# Patient Record
Sex: Male | Born: 1960
Health system: Southern US, Community
[De-identification: ages and names within clinical notes are randomized; demographics above are authoritative.]

## PROBLEM LIST (undated history)

## (undated) DIAGNOSIS — K219 Gastro-esophageal reflux disease without esophagitis: Secondary | ICD-10-CM

## (undated) HISTORY — PX: TONSILLECTOMY: SUR1361

## (undated) HISTORY — DX: Gastro-esophageal reflux disease without esophagitis: K21.9

---

## 2001-04-10 ENCOUNTER — Emergency Department (HOSPITAL_COMMUNITY): Admission: EM | Admit: 2001-04-10 | Discharge: 2001-04-10 | Payer: Self-pay | Admitting: Emergency Medicine

## 2001-04-10 ENCOUNTER — Encounter: Payer: Self-pay | Admitting: Emergency Medicine

## 2008-12-18 ENCOUNTER — Ambulatory Visit: Payer: Self-pay | Admitting: Diagnostic Radiology

## 2008-12-18 ENCOUNTER — Emergency Department (HOSPITAL_BASED_OUTPATIENT_CLINIC_OR_DEPARTMENT_OTHER): Admission: EM | Admit: 2008-12-18 | Discharge: 2008-12-18 | Payer: Self-pay | Admitting: Emergency Medicine

## 2009-07-19 ENCOUNTER — Emergency Department (HOSPITAL_BASED_OUTPATIENT_CLINIC_OR_DEPARTMENT_OTHER): Admission: EM | Admit: 2009-07-19 | Discharge: 2009-07-19 | Payer: Self-pay | Admitting: Emergency Medicine

## 2009-07-25 ENCOUNTER — Ambulatory Visit: Payer: Self-pay | Admitting: Diagnostic Radiology

## 2009-07-25 ENCOUNTER — Emergency Department (HOSPITAL_BASED_OUTPATIENT_CLINIC_OR_DEPARTMENT_OTHER): Admission: EM | Admit: 2009-07-25 | Discharge: 2009-07-26 | Payer: Self-pay | Admitting: Emergency Medicine

## 2010-02-27 ENCOUNTER — Emergency Department (HOSPITAL_BASED_OUTPATIENT_CLINIC_OR_DEPARTMENT_OTHER): Admission: EM | Admit: 2010-02-27 | Discharge: 2010-02-27 | Payer: Self-pay | Admitting: Emergency Medicine

## 2010-11-05 LAB — BASIC METABOLIC PANEL
BUN: 11 mg/dL (ref 6–23)
CO2: 28 mEq/L (ref 19–32)
Calcium: 9.6 mg/dL (ref 8.4–10.5)
Chloride: 105 mEq/L (ref 96–112)
Creatinine, Ser: 0.9 mg/dL (ref 0.4–1.5)
GFR calc Af Amer: >60 mL/min (ref >60)
GFR calc non Af Amer: >60 mL/min (ref >60)
Glucose, Bld: 103 mg/dL — ABNORMAL HIGH (ref 70–99)
Potassium: 4 mEq/L (ref 3.5–5.1)
Sodium: 142 mEq/L (ref 135–145)

## 2010-11-05 LAB — DIFFERENTIAL
Basophils Absolute: 0.1 10*3/uL (ref 0.0–0.1)
Basophils Relative: 1 % (ref 0–1)
Lymphocytes Relative: 23 % (ref 12–46)
Neutro Abs: 4.8 10*3/uL (ref 1.7–7.7)
Neutrophils Relative %: 64 % (ref 43–77)

## 2010-11-05 LAB — CBC
Hemoglobin: 15.3 g/dL (ref 13.0–17.0)
MCHC: 34.1 g/dL (ref 30.0–36.0)
Platelets: 219 10*3/uL (ref 150–400)
RDW: 12.2 % (ref 11.5–15.5)

## 2010-11-13 LAB — ETHANOL: Alcohol, Ethyl (B): 212 mg/dL — ABNORMAL HIGH (ref 0–10)

## 2015-08-20 ENCOUNTER — Emergency Department (HOSPITAL_BASED_OUTPATIENT_CLINIC_OR_DEPARTMENT_OTHER): Payer: Medicare Other

## 2015-08-20 ENCOUNTER — Encounter (HOSPITAL_BASED_OUTPATIENT_CLINIC_OR_DEPARTMENT_OTHER): Payer: Self-pay | Admitting: Emergency Medicine

## 2015-08-20 ENCOUNTER — Emergency Department (HOSPITAL_BASED_OUTPATIENT_CLINIC_OR_DEPARTMENT_OTHER)
Admission: EM | Admit: 2015-08-20 | Discharge: 2015-08-20 | Disposition: A | Discharge status: Home / Self Care | Payer: Medicare Other | Attending: Emergency Medicine | Admitting: Emergency Medicine

## 2015-08-20 DIAGNOSIS — Z79899 Other long term (current) drug therapy: Secondary | ICD-10-CM | POA: Insufficient documentation

## 2015-08-20 DIAGNOSIS — F17200 Nicotine dependence, unspecified, uncomplicated: Secondary | ICD-10-CM | POA: Diagnosis not present

## 2015-08-20 DIAGNOSIS — F17210 Nicotine dependence, cigarettes, uncomplicated: Secondary | ICD-10-CM | POA: Insufficient documentation

## 2015-08-20 DIAGNOSIS — R05 Cough: Secondary | ICD-10-CM | POA: Diagnosis present

## 2015-08-20 DIAGNOSIS — J40 Bronchitis, not specified as acute or chronic: Secondary | ICD-10-CM | POA: Diagnosis not present

## 2015-08-20 DIAGNOSIS — J209 Acute bronchitis, unspecified: Secondary | ICD-10-CM | POA: Insufficient documentation

## 2015-08-20 MED ORDER — ALBUTEROL SULFATE HFA 108 (90 BASE) MCG/ACT IN AERS
2.0000 | INHALATION_SPRAY | RESPIRATORY_TRACT | Status: DC | PRN
  Administered 2015-08-20: 2 via RESPIRATORY_TRACT
  Filled 2015-08-20: qty 6.7

## 2015-08-20 MED ORDER — IPRATROPIUM BROMIDE 0.02 % IN SOLN: 0.5000 mg | Freq: Once | RESPIRATORY_TRACT | Status: DC

## 2015-08-20 MED ORDER — AMOXICILLIN-POT CLAVULANATE 875-125 MG PO TABS: 1.0000 | ORAL_TABLET | Freq: Two times a day (BID) | ORAL | Status: DC

## 2015-08-20 MED ORDER — ALBUTEROL SULFATE (2.5 MG/3ML) 0.083% IN NEBU
2.5000 mg | INHALATION_SOLUTION | Freq: Once | RESPIRATORY_TRACT | Status: AC
  Administered 2015-08-20: 2.5 mg via RESPIRATORY_TRACT
  Filled 2015-08-20: qty 3

## 2015-08-20 MED ORDER — IPRATROPIUM-ALBUTEROL 0.5-2.5 (3) MG/3ML IN SOLN
3.0000 mL | Freq: Once | RESPIRATORY_TRACT | Status: AC
  Administered 2015-08-20: 3 mL via RESPIRATORY_TRACT
  Filled 2015-08-20: qty 3

## 2015-08-20 MED ORDER — PREDNISONE 20 MG PO TABS: 40.0000 mg | ORAL_TABLET | Freq: Every day | ORAL | Status: DC

## 2015-08-20 MED ORDER — ALBUTEROL SULFATE (2.5 MG/3ML) 0.083% IN NEBU: 5.0000 mg | INHALATION_SOLUTION | Freq: Once | RESPIRATORY_TRACT | Status: DC

## 2015-08-20 NOTE — ED Provider Notes (Signed)
CSN: [id number]     Arrival date & time 08/20/15  54 History   First MD Initiated Contact with Patient 08/20/15 1635     Chief Complaint  Patient presents with  . Cough     (Consider location/radiation/quality/duration/timing/severity/associated sxs/prior Treatment) HPI Comments: Patient presents with complaints of wheezing and cough productive of sputum over the past 4 weeks. Reports intermittent subjective fevers. He has been using an albuterol inhaler that he has at home with some relief but is nearly out. No URI symptoms. No lower extremity swelling or pain. No nausea, vomiting, or diarrhea. Patient has not been diagnosed with COPD but has an approximately 40-pack-year smoking history. No other treatments prior to arrival. No evidence to contacts.  Patient is a 55 y.o. male presenting with cough. The history is provided by the patient.  Cough Associated symptoms: fever (Subjective) and wheezing   Associated symptoms: no chest pain, no chills, no headaches, no myalgias, no rash, no rhinorrhea, no shortness of breath and no sore throat     History reviewed. No pertinent past medical history. History reviewed. No pertinent past surgical history. History reviewed. No pertinent family history. Social History  Substance Use Topics  . Smoking status: Current Every Day Smoker  . Smokeless tobacco: None  . Alcohol Use: Yes     Comment: occ    Review of Systems  Constitutional: Positive for fever (Subjective). Negative for chills.  HENT: Negative for rhinorrhea and sore throat.   Eyes: Negative for redness.  Respiratory: Positive for cough and wheezing. Negative for shortness of breath.   Cardiovascular: Negative for chest pain.  Gastrointestinal: Negative for nausea, vomiting, abdominal pain and diarrhea.  Genitourinary: Negative for dysuria.  Musculoskeletal: Negative for myalgias.  Skin: Negative for rash.  Neurological: Negative for headaches.      Allergies  Asa and  Codeine  Home Medications   Prior to Admission medications   Medication Sig Start Date End Date Taking? Authorizing Provider  omeprazole (PRILOSEC) 10 MG capsule Take 10 mg by mouth daily.   Yes Historical Provider, MD   BP 146/95 mmHg  Pulse 70  Temp(Src) 98 F (36.7 C) (Oral)  Resp 20  Ht 5\' 9"  (1.753 m)  Wt 102.059 kg  BMI 33.21 kg/m2  SpO2 100% Physical Exam  Constitutional: He appears well-developed and well-nourished.  HENT:  Head: Normocephalic and atraumatic.  Right Ear: Tympanic membrane, external ear and ear canal normal.  Left Ear: Tympanic membrane and ear canal normal.  Nose: Nose normal. No mucosal edema or rhinorrhea.  Mouth/Throat: Oropharynx is clear and moist. No oropharyngeal exudate, posterior oropharyngeal edema or posterior oropharyngeal erythema.  Eyes: Conjunctivae are normal. Right eye exhibits no discharge. Left eye exhibits no discharge.  Neck: Normal range of motion. Neck supple.  Cardiovascular: Normal rate, regular rhythm and normal heart sounds.   Pulmonary/Chest: Effort normal. No respiratory distress. He has wheezes (Mild scattered expiratory wheezing noted). He has no rales.  Abdominal: Soft. There is no tenderness.  Neurological: He is alert.  Skin: Skin is warm and dry.  Psychiatric: He has a normal mood and affect.  Nursing note and vitals reviewed.   ED Course  Procedures (including critical care time) Labs Review Labs Reviewed - No data to display  Imaging Review Dg Chest 2 View  08/20/2015  CLINICAL DATA:  Productive cough with brown sputum, LEFT lower chest pain from excessive coughing, congestion, body aches, lethargy and migraine for 3-4 weeks, history of bronchitis versus asthma, smoker EXAM: CHEST  2 VIEW COMPARISON:  None FINDINGS: Normal heart size, mediastinal contours, and pulmonary vascularity. Minimal central peribronchial thickening. No pulmonary infiltrate, pleural effusion or pneumothorax. Bones unremarkable. IMPRESSION:  Minimal bronchitic changes without infiltrate. Electronically Signed   By: Lavonia Dana M.D.   On: 08/20/2015 16:20   I have personally reviewed and evaluated these images and lab results as part of my medical decision-making.   EKG Interpretation None       Patient seen and examined. Medications ordered. Patient informed of x-ray results.  Vital signs reviewed and are as follows: BP 146/95 mmHg  Pulse 70  Temp(Src) 98 F (36.7 C) (Oral)  Resp 20  Ht 5\' 9"  (1.753 m)  Wt 102.059 kg  BMI 33.21 kg/m2  SpO2 100%  Patient feels somewhat better after breathing treatment. Will discharge to home with course of prednisone, Augmentin, and with albuterol inhaler.  Patient counseled on use of albuterol HFA. Instructed to use 1-2 puffs q 4 hours as needed for SOB.  Encouraged return to the emergency department with worsening shortness of breath, trouble breathing, fever, or other concerns. Patient verbalizes understanding and agrees with plan.   MDM   Final diagnoses:  Bronchitis   Smoker with persistent cough, increased production of mucus, and increased wheezing over the past 4 weeks. Symptoms improved with treatment in emergency department. Chest x-ray is negative for pneumonia. Feel this is likely bronchitis. Patient does not have diagnosed history of COPD. Will treat as mild COPD exacerbation, bronchitis with steroid and antibiotic. Patient otherwise appears well. Vital signs are normal. Feel that he can safely be discharged home with follow-up.  Carlisle Cater, PA-C 08/20/15 [place], MD 08/20/15 1949

## 2015-08-20 NOTE — Discharge Instructions (Signed)
Please read and follow all provided instructions.  Your diagnoses today include:  1. Bronchitis    Tests performed today include:  Chest x-ray - did not show pneumonia  Vital signs. See below for your results today.   Medications prescribed:   Albuterol inhaler - medication that opens up your airway  Use inhaler as follows: 1-2 puffs with spacer every 4 hours as needed for wheezing, cough, or shortness of breath.    Prednisone - steroid medicine   It is best to take this medication in the morning to prevent sleeping problems. If you are diabetic, monitor your blood sugar closely and stop taking Prednisone if blood sugar is over 300. Take with food to prevent stomach upset.    Augmentin - antibiotic  You have been prescribed an antibiotic medicine: take the entire course of medicine even if you are feeling better. Stopping early can cause the antibiotic not to work.  Take any prescribed medications only as directed.  Home care instructions:  Follow any educational materials contained in this packet.  Follow-up instructions: Please follow-up with your primary care provider in the next 7 days for further evaluation of your symptoms and management of your COPD.   Return instructions:   Please return to the Emergency Department if you experience worsening symptoms.  Please return with worsening wheezing, shortness of breath, or difficulty breathing.  Return with persistent fever above 101F.   Please return if you have any other emergent concerns.  Additional Information:  Your vital signs today were: BP 146/95 mmHg   Pulse 70   Temp(Src) 98 F (36.7 C) (Oral)   Resp 20   Ht 5\' 9"  (1.753 m)   Wt 102.059 kg   BMI 33.21 kg/m2   SpO2 100% If your blood pressure (BP) was elevated above 135/85 this visit, please have this repeated by your doctor within one month. --------------

## 2015-08-20 NOTE — ED Notes (Signed)
Patient states that he has had a cough and cold x 4 weeks. Reports that he is having chest tightness with cough.

## 2015-10-28 DIAGNOSIS — M25511 Pain in right shoulder: Secondary | ICD-10-CM | POA: Diagnosis not present

## 2015-12-15 DIAGNOSIS — A6920 Lyme disease, unspecified: Secondary | ICD-10-CM | POA: Diagnosis not present

## 2015-12-15 DIAGNOSIS — K219 Gastro-esophageal reflux disease without esophagitis: Secondary | ICD-10-CM | POA: Diagnosis not present

## 2015-12-15 DIAGNOSIS — M5412 Radiculopathy, cervical region: Secondary | ICD-10-CM | POA: Diagnosis not present

## 2015-12-15 DIAGNOSIS — R062 Wheezing: Secondary | ICD-10-CM | POA: Diagnosis not present

## 2016-02-10 ENCOUNTER — Emergency Department (HOSPITAL_BASED_OUTPATIENT_CLINIC_OR_DEPARTMENT_OTHER)
Admission: EM | Admit: 2016-02-10 | Discharge: 2016-02-10 | Disposition: A | Discharge status: Home / Self Care | Payer: Medicare Other | Attending: Emergency Medicine | Admitting: Emergency Medicine

## 2016-02-10 ENCOUNTER — Encounter (HOSPITAL_BASED_OUTPATIENT_CLINIC_OR_DEPARTMENT_OTHER): Payer: Self-pay | Admitting: Emergency Medicine

## 2016-02-10 ENCOUNTER — Emergency Department (HOSPITAL_BASED_OUTPATIENT_CLINIC_OR_DEPARTMENT_OTHER): Payer: Medicare Other

## 2016-02-10 DIAGNOSIS — Y939 Activity, unspecified: Secondary | ICD-10-CM | POA: Diagnosis not present

## 2016-02-10 DIAGNOSIS — F17200 Nicotine dependence, unspecified, uncomplicated: Secondary | ICD-10-CM | POA: Insufficient documentation

## 2016-02-10 DIAGNOSIS — Y999 Unspecified external cause status: Secondary | ICD-10-CM | POA: Insufficient documentation

## 2016-02-10 DIAGNOSIS — Y929 Unspecified place or not applicable: Secondary | ICD-10-CM | POA: Insufficient documentation

## 2016-02-10 DIAGNOSIS — M79642 Pain in left hand: Secondary | ICD-10-CM | POA: Diagnosis not present

## 2016-02-10 DIAGNOSIS — M795 Residual foreign body in soft tissue: Secondary | ICD-10-CM | POA: Diagnosis not present

## 2016-02-10 DIAGNOSIS — W109XXA Fall (on) (from) unspecified stairs and steps, initial encounter: Secondary | ICD-10-CM | POA: Diagnosis not present

## 2016-02-10 DIAGNOSIS — W19XXXA Unspecified fall, initial encounter: Secondary | ICD-10-CM

## 2016-02-10 MED ORDER — TRAMADOL HCL 50 MG PO TABS
50.0000 mg | ORAL_TABLET | Freq: Once | ORAL | Status: AC
  Administered 2016-02-10: 50 mg via ORAL
  Filled 2016-02-10: qty 1

## 2016-02-10 MED ORDER — TRAMADOL HCL 50 MG PO TABS: 50.0000 mg | ORAL_TABLET | Freq: Four times a day (QID) | ORAL | Status: DC | PRN

## 2016-02-10 NOTE — ED Notes (Addendum)
Pt in c/o L hand pain after falling on his steps, hand is swollen and bruised appearing. Ambulatory in NAD.

## 2016-02-10 NOTE — ED Notes (Signed)
Fell Thursday, braced fall with L hand, c/o hand pain, swelling noted, skin intact, no redness or bruising noted.

## 2016-02-10 NOTE — ED Notes (Addendum)
EDPA at [facility], pt seen by PA prior to RN assessment, see PA notes, pending orders, xray resulted, reviewed.

## 2016-02-10 NOTE — ED Provider Notes (Signed)
CSN: [id number]     Arrival date & time 02/10/16  2032 History  By signing my name below, I, Randa Evens, attest that this documentation has been prepared under the direction and in the presence of [employer], PA-C. Electronically Signed: Randa Evens, ED Scribe. 02/10/2016. 9:16 PM.    Chief Complaint  Patient presents with  . Hand Pain   The history is provided by the patient. No language interpreter was used.   HPI Comments: Jose Rush is a 55 y.o. male who presents to the Emergency Department complaining of fall onset 2 days prior. Pt states that he fell coming down his steps due to them being wet. Pt states that he fell onto his left hand. Pt is complaining of left hand pain. Pt states that he has some associated swelling, bruising and numbness in all of his fingers. Pt states that he has tried ice, ibuprofen and tylenol with no relief. Pt denies head injury or LOC. Pt denies neck pain or back pain. Pt states that he is left hand dominant.    History reviewed. No pertinent past medical history. History reviewed. No pertinent past surgical history. History reviewed. No pertinent family history. Social History  Substance Use Topics  . Smoking status: Current Every Day Smoker  . Smokeless tobacco: None  . Alcohol Use: Yes     Comment: occ    Review of Systems  Musculoskeletal: Positive for arthralgias.   A complete 10 system review of systems was obtained and all systems are negative except as noted in the HPI and PMH.     Allergies  Asa and Codeine  Home Medications   Prior to Admission medications   Medication Sig Start Date End Date Taking? Authorizing Provider  amoxicillin-clavulanate (AUGMENTIN) 875-125 MG tablet Take 1 tablet by mouth every 12 (twelve) hours. 08/20/15   Carlisle Cater, PA-C  omeprazole (PRILOSEC) 10 MG capsule Take 10 mg by mouth daily.    Historical Provider, MD  predniSONE (DELTASONE) 20 MG tablet Take 2 tablets (40 mg total) by mouth  daily. 08/20/15   Carlisle Cater, PA-C   BP 154/96 mmHg  Pulse 77  Temp(Src) 98.4 F (36.9 C) (Oral)  Resp 18  Ht 5\' 9"  (1.753 m)  Wt 200 lb (90.719 kg)  BMI 29.52 kg/m2  SpO2 99%   Physical Exam  Constitutional: He is oriented to person, place, and time. He appears well-developed and well-nourished. No distress.  HENT:  Head: Normocephalic and atraumatic.  Eyes: Conjunctivae and EOM are normal.  Neck: Normal range of motion. Neck supple.  Cardiovascular: Normal rate and regular rhythm.   Pulses:      Radial pulses are 2+ on the left side.  Pulmonary/Chest: Effort normal and breath sounds normal. No respiratory distress.  Musculoskeletal: Normal range of motion.  No tenderness to cervical, thoracic or lumbar spine. Good ROM of left wrist, left elbow, and left shoulder.  Pain over 4th and 5th metacarpal of the left hand.  Neurological: He is alert and oriented to person, place, and time. Gait normal.  Distal sensation of the left hand is intact  Skin: Skin is warm, dry and intact.  Psychiatric: He has a normal mood and affect. His behavior is normal.  Nursing note and vitals reviewed.   ED Course  Procedures (including critical care time) DIAGNOSTIC STUDIES: Oxygen Saturation is 99% on RA, normal by my interpretation.    COORDINATION OF CARE: 9:14 PM-Discussed treatment plan which includes hand x -ray with pt at  bedside and pt agreed to plan.     Labs Review Labs Reviewed - No data to display  Imaging Review Dg Hand Complete Left  02/10/2016  CLINICAL DATA:  Left hand pain after fall on steps. Swelling and bruising. EXAM: LEFT HAND - COMPLETE 3+ VIEW COMPARISON:  None. FINDINGS: Remote fifth metacarpal fracture with healed fracture deformity. No acute fracture or subluxation. The alignment and joint spaces are maintained. Mild soft tissue edema about the metacarpals. Linear 4 mm metallic foreign body in the soft tissues about the radial aspect of the second metacarpal head,  likely remote. IMPRESSION: 1. No acute fracture or subluxation. 2. Remote healed fifth metacarpal fracture. 3. Small metallic foreign body in the soft tissues adjacent to the second metacarpal head, likely remote. Electronically Signed   By: Jeb Levering M.D.   On: 02/10/2016 21:05   I have personally reviewed and evaluated these images and lab results as part of my medical decision-making.   EKG Interpretation None      MDM   Final diagnoses:  None  Patient presents today with left hand pain after falling down the steps two days ago.  No spinal tenderness on exam.  He denies any head injury or trauma.  He is ambulating without difficulty.  Full ROM of the left wrist, elbow, and shoulder.  Xray showing no acute fracture or subluxation.  Xray showing small metallic foreign body in the soft tissues, likely remote.  His skin is intact at this time.  No laceration or puncture wound associated with this fall.  Therefore, feel that this foreign body is not acute.  He is neurovascularly intact.  Stable for discharge.  Return precautions given.  I personally performed the services described in this documentation, which was scribed in my presence. The recorded information has been reviewed and is accurate.      Hyman Bible, PA-C 02/11/16 2354  Orlie Dakin, MD 02/12/16 Shelah Lewandowsky

## 2016-07-16 DIAGNOSIS — M2550 Pain in unspecified joint: Secondary | ICD-10-CM | POA: Diagnosis not present

## 2016-07-16 DIAGNOSIS — G44209 Tension-type headache, unspecified, not intractable: Secondary | ICD-10-CM | POA: Diagnosis not present

## 2016-08-13 ENCOUNTER — Encounter (HOSPITAL_BASED_OUTPATIENT_CLINIC_OR_DEPARTMENT_OTHER): Payer: Self-pay | Admitting: *Deleted

## 2016-08-13 ENCOUNTER — Emergency Department (HOSPITAL_BASED_OUTPATIENT_CLINIC_OR_DEPARTMENT_OTHER)
Admission: EM | Admit: 2016-08-13 | Discharge: 2016-08-13 | Disposition: A | Discharge status: Home / Self Care | Payer: Medicare Other | Attending: Emergency Medicine | Admitting: Emergency Medicine

## 2016-08-13 DIAGNOSIS — M542 Cervicalgia: Secondary | ICD-10-CM | POA: Insufficient documentation

## 2016-08-13 DIAGNOSIS — F17200 Nicotine dependence, unspecified, uncomplicated: Secondary | ICD-10-CM | POA: Diagnosis not present

## 2016-08-13 DIAGNOSIS — M791 Myalgia: Secondary | ICD-10-CM | POA: Diagnosis not present

## 2016-08-13 DIAGNOSIS — Z79899 Other long term (current) drug therapy: Secondary | ICD-10-CM | POA: Diagnosis not present

## 2016-08-13 DIAGNOSIS — F1290 Cannabis use, unspecified, uncomplicated: Secondary | ICD-10-CM | POA: Diagnosis not present

## 2016-08-13 MED ORDER — NAPROXEN 250 MG PO TABS: 250.0000 mg | ORAL_TABLET | Freq: Two times a day (BID) | ORAL | 0 refills | Status: DC

## 2016-08-13 MED ORDER — KETOROLAC TROMETHAMINE 60 MG/2ML IM SOLN
30.0000 mg | Freq: Once | INTRAMUSCULAR | Status: AC
  Administered 2016-08-13: 30 mg via INTRAMUSCULAR
  Filled 2016-08-13: qty 2

## 2016-08-13 MED ORDER — METHOCARBAMOL 500 MG PO TABS: 500.0000 mg | ORAL_TABLET | Freq: Two times a day (BID) | ORAL | 0 refills | Status: DC | PRN

## 2016-08-13 NOTE — ED Triage Notes (Signed)
Pt /o neck pain which radiates down left neck x 1 month

## 2016-08-13 NOTE — ED Provider Notes (Signed)
[facility] Provider Note   CSN: [id number] Arrival date & time: 08/13/16  2027   By signing my name below, I, Neta Mends, attest that this documentation has been prepared under the direction and in the presence of Waynetta Pean, PA-C. Electronically Signed: Neta Mends, ED Scribe. 08/13/2016. 10:03 PM.   History   Chief Complaint Chief Complaint  Patient presents with  . Neck Pain    The history is provided by the patient. No language interpreter was used.   HPI Comments:  Jose Rush is a 56 y.o. male who presents to the Emergency Department complaining of atraumatic constant left neck pain x 1 month. He Reports history of the same pain before and has seen his primary care provider previously about this 1 month ago. Pt states that the pain radiates to his left shoulder down his left arm. Pt states that the pain is exacerbated when moving. Pt complains of associated tingling in his left arm. He reports the pain is worse with movement and with touching the area. He has taken nothing for treatment of his symptoms today. Pt sees a PCP who has treated him with Tramadol which has provided no relief. No alleviating factors noted for current symptoms. Pt denies injury/trauma. Pt denies fever, neck stiffness, headache, trouble swallowing, visual changes, chest pain, weakness, cough, SOB.   History reviewed. No pertinent past medical history.  There are no active problems to display for this patient.   Past Surgical History:  Procedure Laterality Date  . TONSILLECTOMY         Home Medications    Prior to Admission medications   Medication Sig Start Date End Date Taking? Authorizing Provider  traMADol (ULTRAM) 50 MG tablet Take by mouth every 6 (six) hours as needed.   Yes Historical Provider, MD  methocarbamol (ROBAXIN) 500 MG tablet Take 1 tablet (500 mg total) by mouth 2 (two) times daily as needed for muscle spasms. 08/13/16   Waynetta Pean, PA-C    naproxen (NAPROSYN) 250 MG tablet Take 1 tablet (250 mg total) by mouth 2 (two) times daily with a meal. 08/13/16   Waynetta Pean, PA-C  omeprazole (PRILOSEC) 10 MG capsule Take 10 mg by mouth daily.    Historical Provider, MD  predniSONE (DELTASONE) 20 MG tablet Take 2 tablets (40 mg total) by mouth daily. 08/20/15   Carlisle Cater, PA-C  traMADol (ULTRAM) 50 MG tablet Take 1 tablet (50 mg total) by mouth every 6 (six) hours as needed. 02/10/16   Hyman Bible, PA-C    Family History History reviewed. No pertinent family history.  Social History Social History  Substance Use Topics  . Smoking status: Current Every Day Smoker    Packs/day: 1.00  . Smokeless tobacco: Not on file  . Alcohol use Yes     Comment: occ     Allergies   Asa [aspirin] and Codeine   Review of Systems Review of Systems  Constitutional: Negative for fever.  HENT: Negative for sore throat and trouble swallowing.   Eyes: Negative for photophobia.  Respiratory: Negative for cough and shortness of breath.   Cardiovascular: Negative for chest pain.  Musculoskeletal: Positive for myalgias and neck pain. Negative for back pain and neck stiffness.  Skin: Negative for rash.  Neurological: Negative for weakness and numbness.     Physical Exam Updated Vital Signs BP 140/99   Pulse 84   Temp 97.9 F (36.6 C)   Resp 18   Ht 5\' 9"  (  1.753 m)   Wt 90.7 kg   SpO2 100%   BMI 29.53 kg/m   Physical Exam  Constitutional: He appears well-developed and well-nourished. No distress.  Nontoxic appearing.  HENT:  Head: Normocephalic and atraumatic.  Eyes: Right eye exhibits no discharge. Left eye exhibits no discharge.  Neck: Normal range of motion. Neck supple. No JVD present. No tracheal deviation present.  Tenderness across his left trapezius musculature which reproduces his pain. No midline neck TTP. Good ROM.   Cardiovascular: Normal rate, regular rhythm and intact distal pulses.   Pulmonary/Chest: Effort  normal. No stridor. No respiratory distress.  Musculoskeletal: Normal range of motion. He exhibits tenderness. He exhibits no edema or deformity.  Tenderness over left trapezius. No midline neck or back TTP. No rashes. No overlying skin changes. Good grip strength bilaterally. Good strength in his bilateral upper extremities.  Lymphadenopathy:    He has no cervical adenopathy.  Neurological: He is alert. No sensory deficit. Coordination normal.  Sensation intact his bilateral upper extremities.  Skin: Skin is warm and dry. Capillary refill takes less than 2 seconds. No rash noted. He is not diaphoretic. No erythema. No pallor.  Psychiatric: He has a normal mood and affect. His behavior is normal.  Nursing note and vitals reviewed.    ED Treatments / Results  DIAGNOSTIC STUDIES:  Oxygen Saturation is 100% on RA, normal by my interpretation.    COORDINATION OF CARE:  10:03 PM Discussed treatment plan with pt at bedside and pt agreed to plan.   Labs (all labs ordered are listed, but only abnormal results are displayed) Labs Reviewed - No data to display  EKG  EKG Interpretation None       Radiology No results found.  Procedures Procedures (including critical care time)  Medications Ordered in ED Medications  ketorolac (TORADOL) injection 30 mg (30 mg Intramuscular Given 08/13/16 2212)     Initial Impression / Assessment and Plan / ED Course  I have reviewed the triage vital signs and the nursing notes.  Pertinent labs & imaging results that were available during my care of the patient were reviewed by me and considered in my medical decision making (see chart for details).  Clinical Course    This is a 56 y.o. male who presents to the Emergency Department complaining of atraumatic constant left neck pain x 1 month. He Reports history of the same pain before and has seen his primary care provider previously about this 1 month ago. Pt states that the pain radiates to his  left shoulder down his left arm. Pt states that the pain is exacerbated when moving. Pt complains of associated tingling in his left arm. He reports the pain is worse with movement and with touching the area. He has taken nothing for treatment of his symptoms today. Pt sees a PCP who has treated him with Tramadol which has provided no relief. He denies fevers, chest pain, SOB, weakness, or headache.  On exam he is afebrile and non-toxic appearing. His tenderness across his left trapezius musculature. He does report some tingling. He is neurovascularly intact. His pain is worse with palpation over his left trapezius and with movement. No meningeal signs. Suspect muscle pain but there could also be combined with some radicular pain. Will treat with naproxen and Robaxin have him follow-up closely with PCP. I discussed that he may need additional imaging by primary care for his radicular pain. I provided him with follow-up with sports medicine. I advised the patient  to follow-up with their primary care provider this week. I advised the patient to return to the emergency department with new or worsening symptoms or new concerns. The patient verbalized understanding and agreement with plan.     Final Clinical Impressions(s) / ED Diagnoses   Final diagnoses:  Neck pain on left side    New Prescriptions New Prescriptions   METHOCARBAMOL (ROBAXIN) 500 MG TABLET    Take 1 tablet (500 mg total) by mouth 2 (two) times daily as needed for muscle spasms.   NAPROXEN (NAPROSYN) 250 MG TABLET    Take 1 tablet (250 mg total) by mouth 2 (two) times daily with a meal.    I personally performed the services described in this documentation, which was scribed in my presence. The recorded information has been reviewed and is accurate.       Waynetta Pean, PA-C 08/13/16 [place], DO 08/13/16 2250

## 2016-08-13 NOTE — ED Notes (Signed)
States " My pinched nerve is really bothering me" Chronic pain , worse over last 2 months, denies recent injury

## 2016-08-19 ENCOUNTER — Encounter: Payer: Self-pay | Admitting: Family Medicine

## 2016-08-19 ENCOUNTER — Ambulatory Visit (INDEPENDENT_AMBULATORY_CARE_PROVIDER_SITE_OTHER): Payer: Medicare Other | Admitting: Family Medicine

## 2016-08-19 DIAGNOSIS — M5010 Cervical disc disorder with radiculopathy, unspecified cervical region: Secondary | ICD-10-CM | POA: Diagnosis not present

## 2016-08-19 MED ORDER — HYDROCODONE-ACETAMINOPHEN 7.5-325 MG PO TABS: 1.0000 | ORAL_TABLET | Freq: Four times a day (QID) | ORAL | 0 refills | Status: DC | PRN

## 2016-08-19 MED ORDER — PREDNISONE 10 MG PO TABS: ORAL_TABLET | ORAL | 0 refills | Status: DC

## 2016-08-19 NOTE — Patient Instructions (Signed)
You have cervical radiculopathy (a pinched nerve in the neck). Prednisone 6 day dose pack to relieve irritation/inflammation of the nerve. Ok to take flexeril or robaxin in addition to this for spasms Norco as needed for severe pain (no driving on this medicine). Consider cervical collar if severely painful. Simple range of motion exercises within limits of pain to prevent further stiffness. Consider physical therapy for stretching, exercises, traction, and modalities in the future. Heat 15 minutes at a time 3-4 times a day to help with spasms. Watch head position when on computers, texting, when sleeping in bed - should in line with back to prevent further nerve traction and irritation. Consider home traction unit if you get benefit with this in physical therapy. If not improving we will consider an MRI. Call me in a week to let me know how you're doing.

## 2016-08-19 NOTE — Progress Notes (Signed)
PCP: No primary care provider on file.  Subjective:   HPI: Patient is a 56 y.o. male here for neck pain.  Patient reports he's had prior issues with his neck. Current problem started 2 months ago. Pain in left side of neck radiating into his fingers with numbness and tingling all digits. Weakness of grip - difficulty holding things. Pain up to 10/10 level, sharp. Told in past he had a pinched nerve. No injury or trauma. Tried robaxin, naproxen from ED. Tried heat/ice. No bowel/bladder dysfunction.  No past medical history on file.  Current Outpatient Prescriptions on File Prior to Visit  Medication Sig Dispense Refill  . methocarbamol (ROBAXIN) 500 MG tablet Take 1 tablet (500 mg total) by mouth 2 (two) times daily as needed for muscle spasms. 20 tablet 0  . omeprazole (PRILOSEC) 10 MG capsule Take 10 mg by mouth daily.     No current facility-administered medications on file prior to visit.     Past Surgical History:  Procedure Laterality Date  . TONSILLECTOMY      Allergies  Allergen Reactions  . Asa [Aspirin] Nausea And Vomiting  . Codeine Nausea And Vomiting    Social History   Social History  . Marital status: Married    Spouse name: N/A  . Number of children: N/A  . Years of education: N/A   Occupational History  . Not on file.   Social History Main Topics  . Smoking status: Current Every Day Smoker    Packs/day: 1.00  . Smokeless tobacco: Never Used  . Alcohol use Yes     Comment: occ  . Drug use:     Types: Marijuana  . Sexual activity: Not on file   Other Topics Concern  . Not on file   Social History Narrative  . No narrative on file    No family history on file.  BP (!) 152/96   Pulse 79   Ht 5\' 9"  (1.753 m)   Wt 185 lb (83.9 kg)   BMI 27.32 kg/m   Review of Systems: See HPI above.     Objective:  Physical Exam:  Gen: NAD, comfortable in exam room  Neck: No gross deformity, swelling, bruising. Spasms left side. TTP left  cervical paraspinal region.  No midline/bony TTP. FROM neck - pain lateral rotations, extension. 4/5 strength with left elbow flexion and extension.  5/5 other muscle groups bilaterally. Sensation intact to light touch.   Trace left bicep MSR, 2+ right, bilateral triceps and brachioradialis tendons. Negative spurlings.   Assessment & Plan:  1. Cervical radiculopathy - with some weakness - ? Related to pain, decreased bicep reflex.  He will start with prednisone dose pack with norco as needed.  ROM exercises, heat for spasms.  Call us in a week.  Discussed ergonomic issues.  Start PT if improving, MRI if not improving.

## 2016-08-20 DIAGNOSIS — M5010 Cervical disc disorder with radiculopathy, unspecified cervical region: Secondary | ICD-10-CM | POA: Insufficient documentation

## 2016-08-20 NOTE — Assessment & Plan Note (Signed)
with some weakness - ? Related to pain, decreased bicep reflex.  He will start with prednisone dose pack with norco as needed.  ROM exercises, heat for spasms.  Call us in a week.  Discussed ergonomic issues.  Start PT if improving, MRI if not improving.

## 2016-09-11 ENCOUNTER — Emergency Department (HOSPITAL_BASED_OUTPATIENT_CLINIC_OR_DEPARTMENT_OTHER)
Admission: EM | Admit: 2016-09-11 | Discharge: 2016-09-11 | Disposition: A | Discharge status: Home / Self Care | Payer: Medicare Other | Attending: Emergency Medicine | Admitting: Emergency Medicine

## 2016-09-11 ENCOUNTER — Encounter (HOSPITAL_BASED_OUTPATIENT_CLINIC_OR_DEPARTMENT_OTHER): Payer: Self-pay

## 2016-09-11 DIAGNOSIS — M7592 Shoulder lesion, unspecified, left shoulder: Secondary | ICD-10-CM | POA: Insufficient documentation

## 2016-09-11 DIAGNOSIS — M779 Enthesopathy, unspecified: Secondary | ICD-10-CM | POA: Diagnosis not present

## 2016-09-11 DIAGNOSIS — F17200 Nicotine dependence, unspecified, uncomplicated: Secondary | ICD-10-CM | POA: Insufficient documentation

## 2016-09-11 DIAGNOSIS — M25512 Pain in left shoulder: Secondary | ICD-10-CM | POA: Diagnosis present

## 2016-09-11 MED ORDER — LIDOCAINE 5 % EX PTCH: 1.0000 | MEDICATED_PATCH | CUTANEOUS | 0 refills | Status: DC

## 2016-09-11 MED ORDER — NAPROXEN 375 MG PO TABS: 375.0000 mg | ORAL_TABLET | Freq: Two times a day (BID) | ORAL | 0 refills | Status: DC

## 2016-09-11 MED ORDER — KETOROLAC TROMETHAMINE 60 MG/2ML IM SOLN
60.0000 mg | Freq: Once | INTRAMUSCULAR | Status: AC
  Administered 2016-09-11: 60 mg via INTRAMUSCULAR
  Filled 2016-09-11: qty 2

## 2016-09-11 NOTE — ED Triage Notes (Signed)
Pt has a pinched nerve in left neck, c/o flare up of left arm pain since yesterday

## 2016-09-11 NOTE — ED Provider Notes (Signed)
[facility] Provider Note   CSN: [id number] Arrival date & time: 09/11/16  2118   By signing my name below, I, Charolotte Eke, attest that this documentation has been prepared under the direction and in the presence of Braylee Lal, MD. Electronically Signed: Charolotte Eke, Scribe. 09/11/16. 11:32 PM.   History   Chief Complaint Chief Complaint  Patient presents with  . Arm Pain    HPI Jose Rush is a 56 y.o. male with h/o pinched nerve who presents to the Emergency Department complaining of gradual onset, moderate-severe chronic left arm pain that worsened last night after a flair up of his pinched nerve. Pt has been in bed since the pain began to worsen. Pt refuses to move arm due to pain. Pt is waiting to hear back from the orthopedist.  The history is provided by the patient. No language interpreter was used.  Shoulder Pain   This is a chronic problem. The current episode started more than 1 week ago. The problem occurs constantly. The problem has not changed since onset.Pain location: left shoulder. The quality of the pain is described as aching. The pain is severe. Pertinent negatives include no numbness, no tingling and no itching. He has tried nothing for the symptoms. The treatment provided no relief. There has been no history of extremity trauma. Family history is significant for no rheumatoid arthritis.    History reviewed. No pertinent past medical history.  Patient Active Problem List   Diagnosis Date Noted  . Cervical disc disorder with radiculopathy of cervical region 08/20/2016    Past Surgical History:  Procedure Laterality Date  . TONSILLECTOMY         Home Medications    Prior to Admission medications   Medication Sig Start Date End Date Taking? Authorizing Provider  HYDROcodone-acetaminophen (NORCO) 7.5-325 MG tablet Take 1 tablet by mouth every 6 (six) hours as needed for moderate pain. 08/19/16   Dene Gentry, MD  methocarbamol  (ROBAXIN) 500 MG tablet Take 1 tablet (500 mg total) by mouth 2 (two) times daily as needed for muscle spasms. 08/13/16   Waynetta Pean, PA-C  omeprazole (PRILOSEC) 10 MG capsule Take 10 mg by mouth daily.    Historical Provider, MD  predniSONE (DELTASONE) 10 MG tablet 6 tabs po day 1, 5 tabs po day 2, 4 tabs po day 3, 3 tabs po day 4, 2 tabs po day 5, 1 tab po day 6 08/19/16   Dene Gentry, MD    Family History No family history on file.  Social History Social History  Substance Use Topics  . Smoking status: Current Every Day Smoker    Packs/day: 1.00  . Smokeless tobacco: Never Used  . Alcohol use Yes     Comment: occ     Allergies   Asa [aspirin] and Codeine   Review of Systems Review of Systems  Constitutional: Negative for fever.  HENT: Negative for congestion.   Respiratory: Negative for shortness of breath.   Cardiovascular: Negative for chest pain, palpitations and leg swelling.  Skin: Negative for itching and wound.  Neurological: Negative for tingling, weakness and numbness.  All other systems reviewed and are negative.    Physical Exam Updated Vital Signs BP 128/87 (BP Location: Right Arm)   Pulse 66   Temp 97.8 F (36.6 C) (Oral)   Resp 16   Ht 5\' 9"  (1.753 m)   Wt 200 lb (90.7 kg)   SpO2 98%   BMI 29.53 kg/m  Physical Exam  Constitutional: He is oriented to person, place, and time. He appears well-developed and well-nourished.  HENT:  Head: Normocephalic and atraumatic.  Nose: Nose normal.  Mouth/Throat: No oropharyngeal exudate.  Eyes: Conjunctivae and EOM are normal.  Neck: Normal range of motion and full passive range of motion without pain. Neck supple. No spinous process tenderness present. No neck rigidity. No erythema and normal range of motion present.  Cardiovascular: Normal rate, normal heart sounds and intact distal pulses.  Exam reveals no friction rub.   Pulmonary/Chest: Effort normal and breath sounds normal.  Abdominal: Soft.  Bowel sounds are normal. He exhibits no mass. There is no tenderness. There is no rebound and no guarding.  Musculoskeletal: Normal range of motion.       Left shoulder: He exhibits no bony tenderness, no swelling, no effusion, no crepitus, no deformity, no laceration, no pain, no spasm, normal pulse and normal strength.  No step off or crepitus cervical or thoracic spine. Intact distal pulses. Intact bicep and tricep distal pulse. Negative NEers test of the left shoulder  Neurological: He is alert and oriented to person, place, and time. He displays normal reflexes.  Skin: Skin is warm and dry. Capillary refill takes less than 2 seconds.  Psychiatric: He has a normal mood and affect.  Nursing note and vitals reviewed.    ED Treatments / Results   DIAGNOSTIC STUDIES: Oxygen Saturation is 100% on room air, normal by my interpretation.    COORDINATION OF CARE: 11:33 PM Discussed treatment plan with pt at bedside and pt agreed to plan.     Procedures Procedures (including critical care time)  Medications Ordered in ED  Medications  ketorolac (TORADOL) injection 60 mg (not administered)   Follow up with Shane Hudnall.  Take all medication as prescribed and continue to range shoulder.   All questions answered to patient's satisfaction. Based on history and exam patient has been appropriately medically screened and emergency conditions excluded. Patient is stable for discharge at this time. Strict return precautions given for any further episodes, persistent fever, weakness or any concerns.  I personally performed the services described in this documentation, which was scribed in my presence. The recorded information has been reviewed and is accurate.       Veatrice Kells, MD 09/11/16 [phone]

## 2016-09-18 ENCOUNTER — Ambulatory Visit: Payer: Medicare Other | Admitting: Family Medicine

## 2016-09-20 DIAGNOSIS — Z23 Encounter for immunization: Secondary | ICD-10-CM | POA: Diagnosis not present

## 2016-09-20 DIAGNOSIS — G44209 Tension-type headache, unspecified, not intractable: Secondary | ICD-10-CM | POA: Diagnosis not present

## 2016-09-20 DIAGNOSIS — Z131 Encounter for screening for diabetes mellitus: Secondary | ICD-10-CM | POA: Diagnosis not present

## 2016-09-20 DIAGNOSIS — Z136 Encounter for screening for cardiovascular disorders: Secondary | ICD-10-CM | POA: Diagnosis not present

## 2016-09-20 DIAGNOSIS — Z0000 Encounter for general adult medical examination without abnormal findings: Secondary | ICD-10-CM | POA: Diagnosis not present

## 2016-09-20 DIAGNOSIS — E785 Hyperlipidemia, unspecified: Secondary | ICD-10-CM | POA: Diagnosis not present

## 2016-09-20 DIAGNOSIS — Z1211 Encounter for screening for malignant neoplasm of colon: Secondary | ICD-10-CM | POA: Diagnosis not present

## 2016-09-20 DIAGNOSIS — K219 Gastro-esophageal reflux disease without esophagitis: Secondary | ICD-10-CM | POA: Diagnosis not present

## 2016-09-20 DIAGNOSIS — Z125 Encounter for screening for malignant neoplasm of prostate: Secondary | ICD-10-CM | POA: Diagnosis not present

## 2016-09-23 ENCOUNTER — Encounter: Payer: Self-pay | Admitting: Family Medicine

## 2016-09-23 ENCOUNTER — Ambulatory Visit (INDEPENDENT_AMBULATORY_CARE_PROVIDER_SITE_OTHER): Payer: Medicare Other | Admitting: Family Medicine

## 2016-09-23 VITALS — BP 150/65 | HR 80 | Ht 69.0 in | Wt 190.0 lb

## 2016-09-23 DIAGNOSIS — M5010 Cervical disc disorder with radiculopathy, unspecified cervical region: Secondary | ICD-10-CM

## 2016-09-23 MED ORDER — HYDROCODONE-ACETAMINOPHEN 7.5-325 MG PO TABS: 1.0000 | ORAL_TABLET | Freq: Four times a day (QID) | ORAL | 0 refills | Status: DC | PRN

## 2016-09-23 NOTE — Patient Instructions (Signed)
You have cervical radiculopathy (a pinched nerve in the neck). We will go ahead with MRI to further assess. Continue the naproxen from the ED. Norco as needed for severe pain (no driving on this medicine). Consider cervical collar if severely painful. Simple range of motion exercises within limits of pain to prevent further stiffness. Heat 15 minutes at a time 3-4 times a day to help with spasms. Watch head position when on computers, texting, when sleeping in bed - should in line with back to prevent further nerve traction and irritation. Consider home traction unit if you get benefit with this in physical therapy.

## 2016-09-25 NOTE — Progress Notes (Addendum)
PCP: No primary care provider on file.  Subjective:   HPI: Patient is a 56 y.o. male here for neck pain.  1/15: Patient reports he's had prior issues with his neck. Current problem started 2 months ago. Pain in left side of neck radiating into his fingers with numbness and tingling all digits. Weakness of grip - difficulty holding things. Pain up to 10/10 level, sharp. Told in past he had a pinched nerve. No injury or trauma. Tried robaxin, naproxen from ED. Tried heat/ice. No bowel/bladder dysfunction.  2/19: Patient reports he feels the same as last visit. Pain is severe, sharp, and constant at 8/10 level left side of neck. Pain and tingling constant from left side of neck down into all fingers of left hand. Given toradol shot in ED which helped temporarily. Taking naproxen with occasional tylenol. Did not notice benefit with prednisone. No skin changes. No new injuries.  No past medical history on file.  Current Outpatient Prescriptions on File Prior to Visit  Medication Sig Dispense Refill  . lidocaine (LIDODERM) 5 % Place 1 patch onto the skin daily. Remove & Discard patch within 12 hours or as directed by MD 14 patch 0  . methocarbamol (ROBAXIN) 500 MG tablet Take 1 tablet (500 mg total) by mouth 2 (two) times daily as needed for muscle spasms. 20 tablet 0  . naproxen (NAPROSYN) 375 MG tablet Take 1 tablet (375 mg total) by mouth 2 (two) times daily. 20 tablet 0  . omeprazole (PRILOSEC) 10 MG capsule Take 10 mg by mouth daily.    . predniSONE (DELTASONE) 10 MG tablet 6 tabs po day 1, 5 tabs po day 2, 4 tabs po day 3, 3 tabs po day 4, 2 tabs po day 5, 1 tab po day 6 21 tablet 0   No current facility-administered medications on file prior to visit.     Past Surgical History:  Procedure Laterality Date  . TONSILLECTOMY      Allergies  Allergen Reactions  . Asa [Aspirin] Nausea And Vomiting  . Codeine Nausea And Vomiting    Social History   Social History  .  Marital status: Married    Spouse name: N/A  . Number of children: N/A  . Years of education: N/A   Occupational History  . Not on file.   Social History Main Topics  . Smoking status: Current Every Day Smoker    Packs/day: 1.00  . Smokeless tobacco: Never Used  . Alcohol use Yes     Comment: occ  . Drug use: Yes    Types: Marijuana  . Sexual activity: Not on file   Other Topics Concern  . Not on file   Social History Narrative  . No narrative on file    No family history on file.  BP (!) 150/65   Pulse 80   Ht 5\' 9"  (1.753 m)   Wt 190 lb (86.2 kg)   BMI 28.06 kg/m   Review of Systems: See HPI above.     Objective:  Physical Exam:  Gen: NAD, comfortable in exam room  Neck: No gross deformity, swelling, bruising. Spasms left side. TTP left cervical paraspinal region.  No midline/bony TTP. FROM neck - pain all motions on left side. 4/5 strength with left elbow flexion and extension.  5/5 other muscle groups bilaterally. Sensation intact to light touch.   Trace left bicep MSR, 2+ right, bilateral triceps and brachioradialis tendons. Negative spurlings.   Assessment & Plan:  1. Cervical radiculopathy -  with some weakness, decreased bicep reflex.  Not improving with conservative measures.  Will go ahead with MRI at this point.  Naproxen with norco as needed.  Heat for spasms.  Will call with results and next steps (injections, neurosurgery referral).    Addendum:  MRI reviewed and discussed with patient.  He has moderate foraminal encroachment at C5-6 level - possible this is source of his pain but no obvious impingement.  Recommended trial of ESI but also going ahead with NCV/EMGs of left upper extremity - possible he has a brachial plexopathy or other cause for neuropathy lower than cervical spine level.

## 2016-09-25 NOTE — Assessment & Plan Note (Signed)
with some weakness, decreased bicep reflex.  Not improving with conservative measures.  Will go ahead with MRI at this point.  Naproxen with norco as needed.  Heat for spasms.  Will call with results and next steps (injections, neurosurgery referral).

## 2016-09-28 ENCOUNTER — Ambulatory Visit (HOSPITAL_BASED_OUTPATIENT_CLINIC_OR_DEPARTMENT_OTHER)
Admission: RE | Admit: 2016-09-28 | Discharge: 2016-09-28 | Disposition: A | Discharge status: Home / Self Care | Payer: Medicare Other | Source: Ambulatory Visit | Attending: Family Medicine | Admitting: Family Medicine

## 2016-09-28 DIAGNOSIS — M4602 Spinal enthesopathy, cervical region: Secondary | ICD-10-CM | POA: Diagnosis not present

## 2016-09-28 DIAGNOSIS — M50321 Other cervical disc degeneration at C4-C5 level: Secondary | ICD-10-CM | POA: Diagnosis not present

## 2016-09-28 DIAGNOSIS — M5010 Cervical disc disorder with radiculopathy, unspecified cervical region: Secondary | ICD-10-CM

## 2016-09-28 DIAGNOSIS — M4802 Spinal stenosis, cervical region: Secondary | ICD-10-CM | POA: Insufficient documentation

## 2016-10-02 ENCOUNTER — Other Ambulatory Visit: Payer: Self-pay | Admitting: Family Medicine

## 2016-10-02 DIAGNOSIS — M542 Cervicalgia: Secondary | ICD-10-CM

## 2016-10-02 NOTE — Addendum Note (Signed)
Addended by: Sherrie George F on: 10/02/2016 11:55 AM   Modules accepted: Orders

## 2016-10-17 ENCOUNTER — Ambulatory Visit
Admission: RE | Admit: 2016-10-17 | Discharge: 2016-10-17 | Disposition: A | Discharge status: Home / Self Care | Payer: Medicare Other | Source: Ambulatory Visit | Attending: Family Medicine | Admitting: Family Medicine

## 2016-10-17 DIAGNOSIS — M47812 Spondylosis without myelopathy or radiculopathy, cervical region: Secondary | ICD-10-CM | POA: Diagnosis not present

## 2016-10-17 DIAGNOSIS — M542 Cervicalgia: Secondary | ICD-10-CM

## 2016-10-17 MED ORDER — TRIAMCINOLONE ACETONIDE 40 MG/ML IJ SUSP (RADIOLOGY)
60.0000 mg | Freq: Once | INTRAMUSCULAR | Status: AC
  Administered 2016-10-17: 60 mg via EPIDURAL

## 2016-10-17 MED ORDER — IOPAMIDOL (ISOVUE-M 300) INJECTION 61%
1.0000 mL | Freq: Once | INTRAMUSCULAR | Status: AC | PRN
  Administered 2016-10-17: 1 mL via EPIDURAL

## 2016-10-17 NOTE — Discharge Instructions (Signed)

## 2016-11-06 ENCOUNTER — Encounter: Payer: Self-pay | Admitting: Neurology

## 2016-11-06 ENCOUNTER — Ambulatory Visit (INDEPENDENT_AMBULATORY_CARE_PROVIDER_SITE_OTHER): Payer: Self-pay | Admitting: Neurology

## 2016-11-06 ENCOUNTER — Ambulatory Visit (INDEPENDENT_AMBULATORY_CARE_PROVIDER_SITE_OTHER): Payer: Medicare Other | Admitting: Neurology

## 2016-11-06 DIAGNOSIS — M79602 Pain in left arm: Secondary | ICD-10-CM | POA: Diagnosis not present

## 2016-11-06 DIAGNOSIS — M5010 Cervical disc disorder with radiculopathy, unspecified cervical region: Secondary | ICD-10-CM

## 2016-11-06 NOTE — Progress Notes (Signed)
Please refer to EMG and nerve conduction study procedure note. 

## 2016-11-06 NOTE — Procedures (Signed)
     HISTORY:  Jose Rush is a 56 year old gentleman with a history of left shoulder and left arm discomfort and numbness that has been present for one year, gradually getting worse. The patient believes that there may be some slight weakness in the arm. He is being evaluated for a possible neuropathy or a cervical radiculopathy.  NERVE CONDUCTION STUDIES:  Nerve conduction studies were performed on the left upper extremity. The distal motor latencies and motor amplitudes for the median and ulnar nerves were within normal limits. The F wave latencies and nerve conduction velocities for these nerves were also normal. The sensory latencies for the median and ulnar nerves were normal.   EMG STUDIES:  EMG study was performed on the left upper extremity:  The first dorsal interosseous muscle reveals 2 to 4 K units with full recruitment. No fibrillations or positive waves were noted. The abductor pollicis brevis muscle reveals 2 to 4 K units with full recruitment. No fibrillations or positive waves were noted. The extensor indicis proprius muscle reveals 1 to 3 K units with full recruitment. No fibrillations or positive waves were noted. The pronator teres muscle reveals 2 to 3 K units with full recruitment. No fibrillations or positive waves were noted. The biceps muscle reveals 1 to 2 K units with full recruitment. No fibrillations or positive waves were noted. The triceps muscle reveals 2 to 4 K units with full recruitment. No fibrillations or positive waves were noted. The anterior deltoid muscle reveals 2 to 3 K units with full recruitment. No fibrillations or positive waves were noted. The cervical paraspinal muscles were tested at 2 levels. No abnormalities of insertional activity were seen at either level tested. There was good relaxation.   IMPRESSION:  Nerve conduction studies done on the left upper extremity were within normal limits. No evidence of a neuropathy is seen. The  patient refused comparison study of the right arm. EMG evaluation of the left upper extremity was unremarkable, without evidence of an overlying cervical radiculopathy.  Jill Alexanders MD 11/06/2016 1:24 PM  [facility] [address] [place], [place] [address]  Phone [phone] Fax [phone]

## 2016-11-19 DIAGNOSIS — R1013 Epigastric pain: Secondary | ICD-10-CM | POA: Diagnosis not present

## 2016-11-19 DIAGNOSIS — K439 Ventral hernia without obstruction or gangrene: Secondary | ICD-10-CM | POA: Diagnosis not present

## 2016-11-19 DIAGNOSIS — Z1211 Encounter for screening for malignant neoplasm of colon: Secondary | ICD-10-CM | POA: Diagnosis not present

## 2016-12-11 ENCOUNTER — Ambulatory Visit (INDEPENDENT_AMBULATORY_CARE_PROVIDER_SITE_OTHER): Payer: Medicare Other | Admitting: Podiatry

## 2016-12-11 ENCOUNTER — Encounter: Payer: Self-pay | Admitting: Podiatry

## 2016-12-11 VITALS — BP 160/98 | HR 79 | Ht 70.0 in | Wt 189.0 lb

## 2016-12-11 DIAGNOSIS — M79672 Pain in left foot: Secondary | ICD-10-CM | POA: Diagnosis not present

## 2016-12-11 DIAGNOSIS — M21969 Unspecified acquired deformity of unspecified lower leg: Secondary | ICD-10-CM | POA: Diagnosis not present

## 2016-12-11 DIAGNOSIS — L600 Ingrowing nail: Secondary | ICD-10-CM

## 2016-12-11 DIAGNOSIS — M79671 Pain in right foot: Secondary | ICD-10-CM | POA: Diagnosis not present
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
## 2016-12-11 NOTE — Patient Instructions (Signed)
Seen for abnormal painful nail 5th left. Noted of normal bone structure under the nail 5th digit. May benefit from total matrixectomy 5th left. Return as needed.

## 2016-12-24 ENCOUNTER — Other Ambulatory Visit: Payer: Self-pay | Admitting: Family Medicine

## 2016-12-24 DIAGNOSIS — K2270 Barrett's esophagus without dysplasia: Secondary | ICD-10-CM | POA: Diagnosis not present

## 2016-12-24 DIAGNOSIS — M5412 Radiculopathy, cervical region: Secondary | ICD-10-CM

## 2016-12-24 DIAGNOSIS — K2930 Chronic superficial gastritis without bleeding: Secondary | ICD-10-CM | POA: Diagnosis not present

## 2016-12-24 DIAGNOSIS — K228 Other specified diseases of esophagus: Secondary | ICD-10-CM | POA: Diagnosis not present

## 2016-12-24 DIAGNOSIS — R12 Heartburn: Secondary | ICD-10-CM | POA: Diagnosis not present

## 2016-12-24 DIAGNOSIS — Z1211 Encounter for screening for malignant neoplasm of colon: Secondary | ICD-10-CM | POA: Diagnosis not present

## 2016-12-24 DIAGNOSIS — K319 Disease of stomach and duodenum, unspecified: Secondary | ICD-10-CM | POA: Diagnosis not present

## 2016-12-24 DIAGNOSIS — D126 Benign neoplasm of colon, unspecified: Secondary | ICD-10-CM | POA: Diagnosis not present

## 2016-12-26 DIAGNOSIS — K319 Disease of stomach and duodenum, unspecified: Secondary | ICD-10-CM | POA: Diagnosis not present

## 2016-12-26 DIAGNOSIS — K2270 Barrett's esophagus without dysplasia: Secondary | ICD-10-CM | POA: Diagnosis not present

## 2017-01-01 ENCOUNTER — Ambulatory Visit: Payer: Medicare Other | Admitting: Podiatry

## 2017-01-02 ENCOUNTER — Ambulatory Visit
Admission: RE | Admit: 2017-01-02 | Discharge: 2017-01-02 | Disposition: A | Discharge status: Home / Self Care | Payer: Medicare Other | Source: Ambulatory Visit | Attending: Family Medicine | Admitting: Family Medicine

## 2017-01-02 DIAGNOSIS — M5412 Radiculopathy, cervical region: Secondary | ICD-10-CM

## 2017-01-02 DIAGNOSIS — M5410 Radiculopathy, site unspecified: Secondary | ICD-10-CM | POA: Diagnosis not present

## 2017-01-02 MED ORDER — IOPAMIDOL (ISOVUE-M 300) INJECTION 61%
1.0000 mL | Freq: Once | INTRAMUSCULAR | Status: AC | PRN
  Administered 2017-01-02: 1 mL via EPIDURAL

## 2017-01-02 MED ORDER — TRIAMCINOLONE ACETONIDE 40 MG/ML IJ SUSP (RADIOLOGY)
60.0000 mg | Freq: Once | INTRAMUSCULAR | Status: AC
  Administered 2017-01-02: 60 mg via EPIDURAL

## 2017-01-08 ENCOUNTER — Ambulatory Visit (INDEPENDENT_AMBULATORY_CARE_PROVIDER_SITE_OTHER): Payer: Medicare Other | Admitting: Podiatry

## 2017-01-08 ENCOUNTER — Encounter: Payer: Self-pay | Admitting: Podiatry

## 2017-01-08 DIAGNOSIS — M7989 Other specified soft tissue disorders: Secondary | ICD-10-CM

## 2017-01-08 DIAGNOSIS — M79675 Pain in left toe(s): Secondary | ICD-10-CM

## 2017-01-08 DIAGNOSIS — K2270 Barrett's esophagus without dysplasia: Secondary | ICD-10-CM | POA: Diagnosis not present

## 2017-01-08 DIAGNOSIS — L600 Ingrowing nail: Secondary | ICD-10-CM | POA: Diagnosis not present

## 2017-01-08 DIAGNOSIS — D122 Benign neoplasm of ascending colon: Secondary | ICD-10-CM | POA: Diagnosis not present

## 2017-01-08 NOTE — Patient Instructions (Signed)
Ingrown nail surgery was done on 5th toe left foot. Follow soaking instruction.  Some redness and drainage is expected. Call the office if the area gets feverish with increased redness and drainage. Return in one week.

## 2017-01-08 NOTE — Progress Notes (Signed)
56 year old male presents for scheduled nail surgery on 5th toe left that has been hurting him for many years while in closed in shoes and ambulation.  Consent form reviewed and signed.  Dx. Deformed nail with pain upon ambulation 5th bilateral. Procedure done: Total matrixectomy 5th toe left.  Affected 5th toe was anesthetized with total 21ml mixture of 50/50 0.5% Marcaine plain and 1% Xylocaine plain. Affected 5th toe nail was reflected with a nail elevator and removed from proximal skin folder. Proximal nail matrix tissue under skin folder was cauterized with Phenol soaked cotton applicator x 4 and neutralized with Alcohol soaked cotton applicator. The wound was dressed with Amerigel ointment dressing. Home care instructions and supply dispensed.  Return in 1 week for follow up.

## 2017-01-15 ENCOUNTER — Encounter: Payer: Self-pay | Admitting: Podiatry

## 2017-01-15 ENCOUNTER — Ambulatory Visit (INDEPENDENT_AMBULATORY_CARE_PROVIDER_SITE_OTHER): Payer: Medicare Other | Admitting: Podiatry

## 2017-01-15 DIAGNOSIS — Z9889 Other specified postprocedural states: Secondary | ICD-10-CM

## 2017-01-15 NOTE — Progress Notes (Signed)
1 week follow up on left 5th toe nail surgery. Wound healing is normal. Keep it covered during the day. Continue soaking till tenderness subside. Return as needed.

## 2017-01-15 NOTE — Patient Instructions (Signed)
1 week follow up on left 5th toe nail surgery. Wound healing is normal. Keep it covered during the day. Continue soaking till tenderness subside. Return as needed.

## 2018-08-10 IMAGING — XA DG INJECT/[PERSON_NAME] INC NEEDLE/CATH/PLC EPI/CERV/THOR W/IMG
2 series · 2 of 2 positions shown · non-contrast
Comparison: none

CLINICAL DATA: Spondylosis without myelopathy. Left arm pain
radiating all the way to the fingers.

[Series 1: ortho standard · 1 of 1 slices shown (1 of 2)]
[im 1/1]
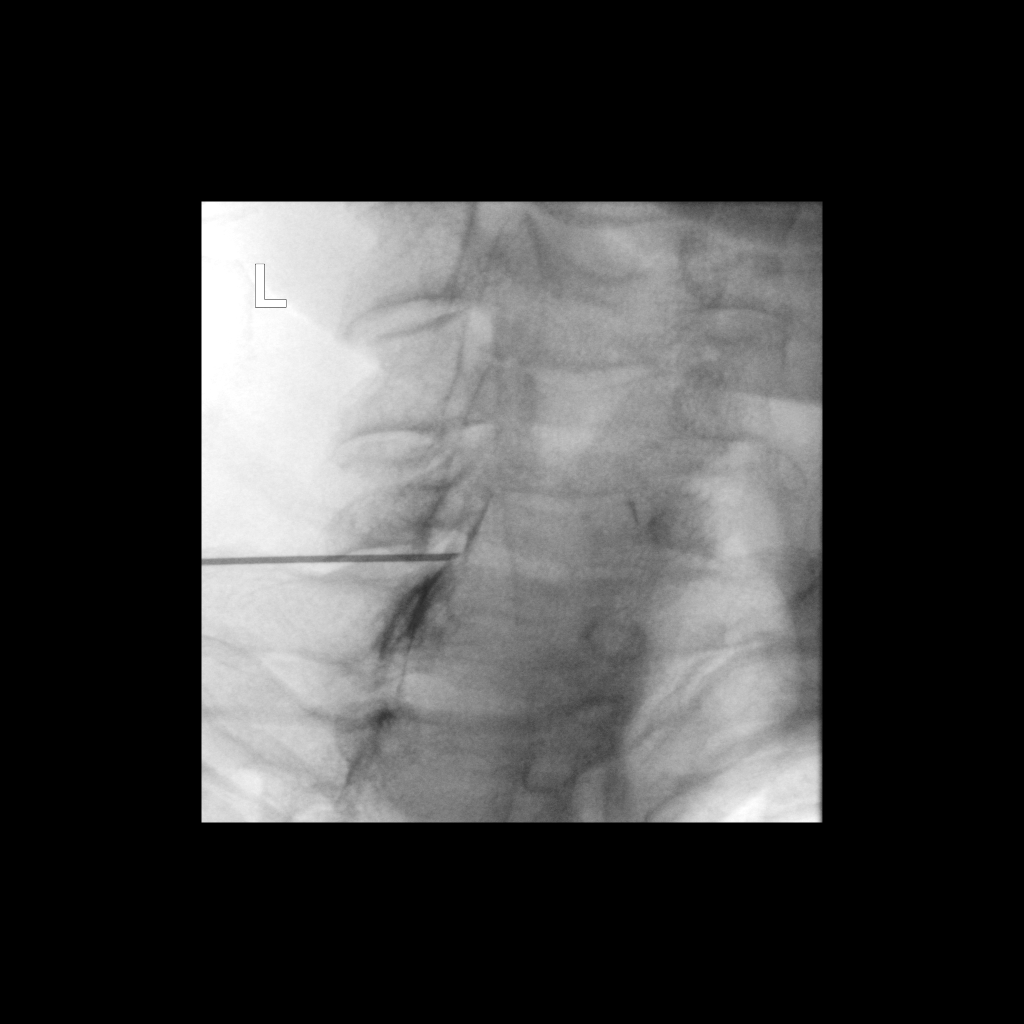

[Series 2: ortho standard · 1 of 1 slices shown (2 of 2)]
[im 1/1]
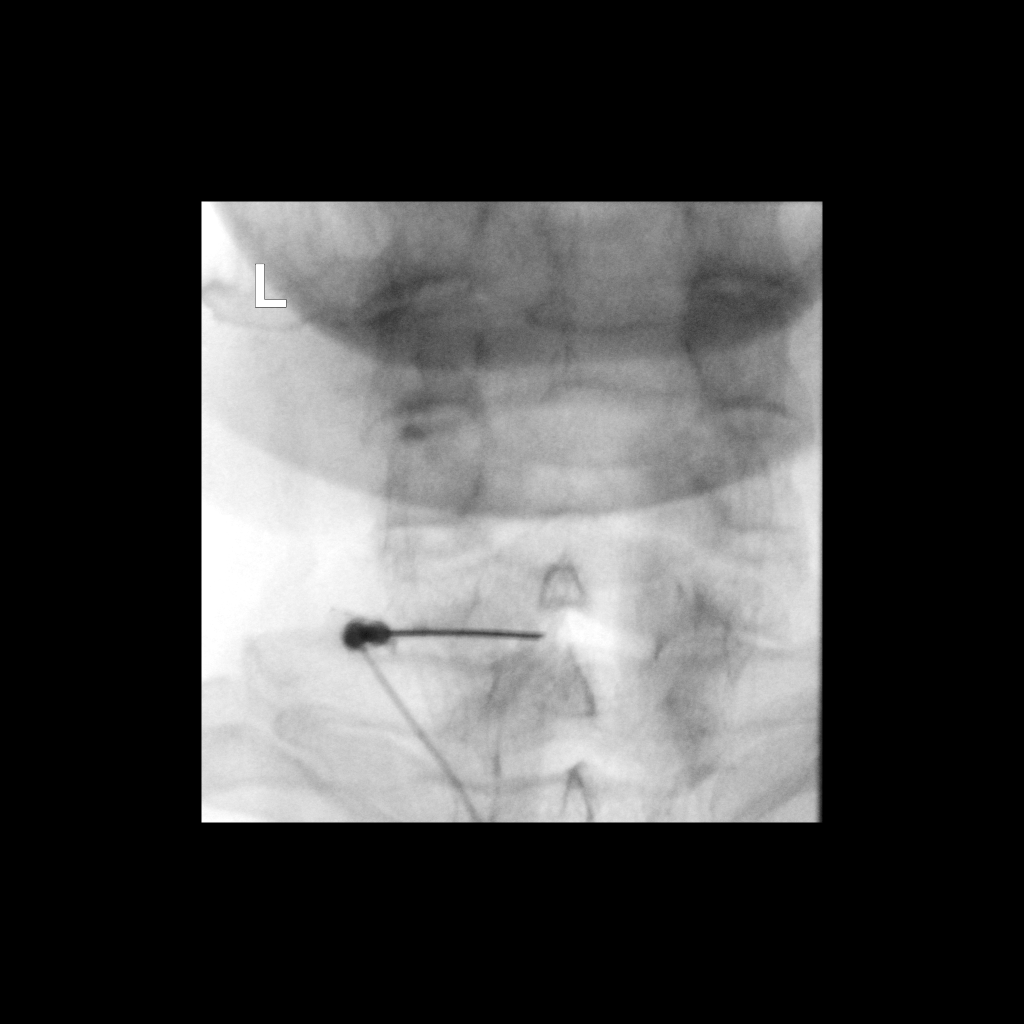

[2 of 2 positions shown; findings below may reference images not displayed]

FLUOROSCOPY TIME:  0 minutes 32 seconds. 27.70 micro gray meter
squared

PROCEDURE:
CERVICAL EPIDURAL INJECTION

An interlaminar approach was performed on the left at C7-T1 . A 20
gauge epidural needle was advanced using loss-of-resistance
technique.

DIAGNOSTIC EPIDURAL INJECTION

Injection of Isovue-M 300 shows a good epidural pattern with spread
above and below the level of needle placement, primarily on the
left. No vascular opacification is seen. THERAPEUTIC

EPIDURAL INJECTION

1.5 ml of Kenalog 40 mixed with 1 ml of 1% Lidocaine and 2 ml of
normal saline were then instilled. The procedure was well-tolerated,
and the patient was discharged thirty minutes following the
injection in good condition.
IMPRESSION: Technically successful initial epidural injection on the left at
C7-T1.

## 2022-09-25 ENCOUNTER — Encounter: Payer: Self-pay | Admitting: Physician Assistant
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
## 2022-10-02 ENCOUNTER — Encounter: Payer: Self-pay | Admitting: Physician Assistant
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
## 2022-10-09 ENCOUNTER — Encounter: Payer: Self-pay | Admitting: Physician Assistant
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
## 2022-10-30 ENCOUNTER — Ambulatory Visit: Payer: Medicare Other | Admitting: Critical Care Medicine
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
## 2023-01-01 ENCOUNTER — Encounter: Payer: Self-pay | Admitting: *Deleted

## 2023-01-01 ENCOUNTER — Other Ambulatory Visit: Payer: Self-pay | Admitting: Critical Care Medicine

## 2023-01-01 ENCOUNTER — Other Ambulatory Visit: Payer: Self-pay

## 2023-01-01 ENCOUNTER — Encounter: Payer: Self-pay | Admitting: Physician Assistant

## 2023-01-01 DIAGNOSIS — L84 Corns and callosities: Secondary | ICD-10-CM

## 2023-01-01 MED ORDER — DOXYCYCLINE HYCLATE 100 MG PO TABS
100.0000 mg | ORAL_TABLET | Freq: Two times a day (BID) | ORAL | 0 refills | Status: AC
  Filled 2023-01-01: qty 10, 5d supply, fill #0
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
## 2023-01-01 NOTE — Congregational Nurse Program (Signed)
  Dept: [phone]   Congregational Nurse Program Note  Date of Encounter: 01/01/2023  Past Medical History: No past medical history on file.  Encounter Details:  CNP Questionnaire - 01/01/23 1104       Questionnaire   Ask client: Do you give verbal consent for me to treat you today? Yes    Student Assistance N/A    Location Patient Served  [facility]    Visit Setting with Client Organization    Patient Status Unhoused    Insurance Medicare    Insurance/Financial Assistance Referral N/A    Medication N/A    Medical Provider No    Screening Referrals Made N/A    Medical Referrals Made [facility] PCP/[facility]inic    Medical Appointment Made Other   [facility] 01/01/23   Recently w/o PCP, now 1st time PCP visit completed due to CNs referral or appointment made N/A    Food N/A    Transportation N/A    Housing/Utilities No permanent housing    Interpersonal Safety N/A    Interventions Advocate/Support    Abnormal to Normal Screening Since Last CN Visit N/A    Screenings CN Performed Blood Pressure;Weight;Pulse Ox    Sent Client to Lab for: N/A    Did client attend any of the following based off CNs referral or appointments made? N/A    ED Visit Averted N/A    Life-Saving Intervention Made N/A             Blood pressure (!) 152/91, pulse 66, height 5\' 9"  (1.753 m), weight 160 lb 6.4 oz (72.8 kg), SpO2 96 %. Client reports to Clinical research associate he has a painful pinky toe on lt foot and blisters on his feet. Completed [facility] triage form for Dr Delford Field and scheduled appt for 2:30 today. Offered support and encouragement. Wavie Hashimi W RN CN

## 2023-01-07 ENCOUNTER — Other Ambulatory Visit: Payer: Self-pay

## 2023-01-23 ENCOUNTER — Ambulatory Visit: Payer: Medicare Other | Admitting: Critical Care Medicine
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
## 2023-01-28 NOTE — Congregational Nurse Program (Signed)
  Dept: [phone]   Congregational Nurse Program Note  Date of Encounter: 01/28/2023  Clinic visit for complaint of rash lower portion of abdomen below naval.  BP 117/80, pulse 73, O2 Sat 95%.  Has small red non raised rash, no drainage or open area with complaint of itching. Has had rash for several days and has been scratching it but skin not broken.  Called PCP office, no appointments available this week, referred to MD clinic at [facility] on tomorrow. Past Medical History: No past medical history on file.  Encounter Details:  CNP Questionnaire - 01/28/23 1010       Questionnaire   Ask client: Do you give verbal consent for me to treat you today? Yes    Student Assistance N/A    Location Patient Served  [facility]    Visit Setting with Client Organization    Patient Status Unhoused    Insurance Medicare    Insurance/Financial Assistance Referral N/A    Medication N/A    Medical Provider Yes    Screening Referrals Made N/A    Medical Referrals Made Non-[facility] PCP/Clinic    Medical Appointment Made N/A   [facility] 01/01/23   Recently w/o PCP, now 1st time PCP visit completed due to CNs referral or appointment made N/A    Food Have Food Insecurities    Transportation Need transportation assistance    Housing/Utilities No permanent housing    Interpersonal Safety Do not feel safe at current residence    Interventions Advocate/Support;Counsel;Navigate Healthcare System    Abnormal to Normal Screening Since Last CN Visit N/A    Screenings CN Performed Blood Pressure;Pulse Ox    Sent Client to Lab for: N/A    Did client attend any of the following based off CNs referral or appointments made? N/A    ED Visit Averted N/A    Life-Saving Intervention Made N/A

## 2023-01-29 ENCOUNTER — Encounter: Payer: Self-pay | Admitting: Physician Assistant

## 2023-01-29 ENCOUNTER — Encounter: Payer: Self-pay | Admitting: *Deleted

## 2023-01-29 NOTE — Progress Notes (Signed)
Pt seen by [facility].  Pt c/o abd rash, has been there about 2 weeks.   There is itching, no spread since it started. No drainage, no systemic sx.  Lesions are in a  2 x 4 inch area.  There are some areas where he has been scratching, they are not draining. He was given diphenhydramine cream, steroid cream and clotrimazole cream.  He is to let us know if he does not get better.  No other issues or concerns  Theodore Demark, PA-C 01/29/2023 2:43 PM

## 2023-01-29 NOTE — Congregational Nurse Program (Signed)
  Dept: [phone]   Congregational Nurse Program Note  Date of Encounter: 01/29/2023  Past Medical History: Past Medical History:  Diagnosis Date   GERD (gastroesophageal reflux disease)     Encounter Details:  Triage form completed for [facility] MD. Client is c/o mid lower abdominal rash.  Mirai Greenwood W RN CN

## 2023-05-19 ENCOUNTER — Other Ambulatory Visit: Payer: Self-pay

## 2023-05-19 ENCOUNTER — Emergency Department (HOSPITAL_COMMUNITY)
Admission: EM | Admit: 2023-05-19 | Discharge: 2023-05-19 | Disposition: A | Discharge status: Home / Self Care | Payer: Medicaid Other | Attending: Emergency Medicine | Admitting: Emergency Medicine

## 2023-05-19 DIAGNOSIS — Z1152 Encounter for screening for COVID-19: Secondary | ICD-10-CM | POA: Insufficient documentation

## 2023-05-19 DIAGNOSIS — J111 Influenza due to unidentified influenza virus with other respiratory manifestations: Secondary | ICD-10-CM

## 2023-05-19 DIAGNOSIS — J029 Acute pharyngitis, unspecified: Secondary | ICD-10-CM | POA: Diagnosis present

## 2023-05-19 LAB — RESP PANEL BY RT-PCR (RSV, FLU A&B, COVID)  RVPGX2
Influenza A by PCR: NEGATIVE
Influenza B by PCR: NEGATIVE
Resp Syncytial Virus by PCR: NEGATIVE
SARS Coronavirus 2 by RT PCR: NEGATIVE

## 2023-05-19 MED ORDER — ACETAMINOPHEN 500 MG PO TABS
500.0000 mg | ORAL_TABLET | Freq: Three times a day (TID) | ORAL | 0 refills | Status: AC | PRN
  Filled 2023-05-19: qty 30, 10d supply, fill #0

## 2023-05-19 MED ORDER — ACETAMINOPHEN 500 MG PO TABS
1000.0000 mg | ORAL_TABLET | Freq: Once | ORAL | Status: AC
  Administered 2023-05-19: 1000 mg via ORAL
  Filled 2023-05-19: qty 2

## 2023-05-19 NOTE — ED Triage Notes (Addendum)
EMS stated, pt is homeless , [place] and said he was fatigue, body aches, weakness, sore throat. Symptoms started yesterday.

## 2023-05-19 NOTE — ED Provider Notes (Signed)
[place] EMERGENCY DEPARTMENT AT [facility] Provider Note  CSN: [id number] Arrival date & time: 05/19/23 4010  Chief Complaint(s) Generalized Body Aches, Fatigue, and Sore Throat  HPI Jose Rush is a 62 y.o. male here today for sore throat, myalgias.  Past Medical History Past Medical History:  Diagnosis Date   GERD (gastroesophageal reflux disease)    Patient Active Problem List   Diagnosis Date Noted   Cervical disc disorder with radiculopathy of cervical region 08/20/2016   Home Medication(s) Prior to Admission medications   Not on File                                                                                                                                    Past Surgical History Past Surgical History:  Procedure Laterality Date   TONSILLECTOMY     Family History No family history on file.  Social History Social History   Tobacco Use   Smoking status: Every Day    Current packs/day: 1.00    Types: Cigarettes   Smokeless tobacco: Never  Substance Use Topics   Alcohol use: Yes    Comment: occ   Drug use: Yes    Types: Marijuana   Allergies Asa [aspirin] and Codeine  Review of Systems Review of Systems  Physical Exam Vital Signs  I have reviewed the triage vital signs BP (!) 128/106 (BP Location: Right Arm)   Pulse 84   Temp 98.6 F (37 C)   Resp 18   SpO2 100%   Physical Exam  ED Results and Treatments Labs (all labs ordered are listed, but only abnormal results are displayed) Labs Reviewed  RESP PANEL BY RT-PCR (RSV, FLU A&B, COVID)  RVPGX2                                                                                                                          Radiology No results found.  Pertinent labs & imaging results that were available during my care of the patient were reviewed by me and considered in my medical decision making (see MDM for details).  Medications Ordered in ED Medications  acetaminophen  (TYLENOL) tablet 1,000 mg (has no administration in time range)  Procedures Procedures  (including critical care time)  Medical Decision Making / ED Course   This patient presents to the ED for concern of sore throat, myalgias, this involves an extensive number of treatment options, and is a complaint that carries with it a high risk of complications and morbidity.  The differential diagnosis includes viral syndrome, peritonsillar abscess, strep pharyngitis.  MDM: 62 year old male here today for sore throat myalgias.  Patient's symptoms most consistent with a viral syndrome.  Will order viral swabs, provide Tylenol.  I considered ordering labs for this patient, however with his reassuring vital signs and exams do not believe this is necessary.  Also considered imaging to evaluate for deep space infection of the neck, however patient has no swelling of the mouth or oropharynx, no palpable mass in the neck, no airway compromise.   Additional history obtained:  -External records from outside source obtained and reviewed including: Chart review including previous notes, labs, imaging, consultation notes   Lab Tests: -I ordered, reviewed, and interpreted labs.   The pertinent results include:   Labs Reviewed  RESP PANEL BY RT-PCR (RSV, FLU A&B, COVID)  RVPGX2        EKG Interpretation Date/Time:    Ventricular Rate:    PR Interval:    QRS Duration:    QT Interval:    QTC Calculation:   R Axis:      Text Interpretation:             Medicines ordered and prescription drug management: Meds ordered this encounter  Medications   acetaminophen (TYLENOL) tablet 1,000 mg    -I have reviewed the patients home medicines and have made adjustments as needed   Social Determinants of Health:  Factors impacting patients care include: Housing  insecurity, patient currently living on the [address].   Reevaluation: After the interventions noted above, I reevaluated the patient and found that they have :improved  Co morbidities that complicate the patient evaluation  Past Medical History:  Diagnosis Date   GERD (gastroesophageal reflux disease)       Dispostion: I considered admission for this patient, however does not meet admission criteria.  Feels better with Tylenol.  Discharged home.     Final Clinical Impression(s) / ED Diagnoses Final diagnoses:  None     @PCDICTATION @    Anders Simmonds T, DO 05/19/23 1154

## 2023-05-19 NOTE — ED Provider Triage Note (Signed)
Emergency Medicine Provider Triage Evaluation Note  SHION BLUESTEIN , a 62 y.o. male  was evaluated in triage.  Pt complains of sore throat, body aches.  Patient says symptoms began yesterday.  Review of Systems  Positive: Sore throat myalgias Negative: Nausea, chest pain.  Physical Exam  There were no vitals taken for this visit. Gen:   Awake, no distress   Resp:  Normal effort  MSK:   Moves extremities without difficulty  Other:  Normal gait, no pooling of secretions.  Pharyngeal erythema.  Medical Decision Making  Medically screening exam initiated at 10:09 AM.  Appropriate orders placed.  DEKKER VERGA was informed that the remainder of the evaluation will be completed by another provider, this initial triage assessment does not replace that evaluation, and the importance of remaining in the ED until their evaluation is complete.  62 year old male here today for sore throat myalgias.  Viral panel ordered.   Anders Simmonds T, DO 05/19/23 1009

## 2023-05-19 NOTE — Discharge Instructions (Addendum)
You can take Tylenol every 8 hours as needed for sore throat.  Your symptoms should begin to improve over the next couple of days.  Follow-up with the [facility] health community clinic within 1 week.  Come back to the emergency department as needed for your medical care.

## 2023-05-21 ENCOUNTER — Inpatient Hospital Stay (HOSPITAL_COMMUNITY)
Admission: EM | Admit: 2023-05-21 | Discharge: 2023-05-23 | DRG: 153 | Disposition: A | Discharge status: Home / Self Care | Payer: Medicare Other | Attending: Internal Medicine | Admitting: Internal Medicine

## 2023-05-21 ENCOUNTER — Encounter (HOSPITAL_COMMUNITY)
Admission: EM | Disposition: A | Discharge status: Home / Self Care | Payer: Self-pay | Source: Home / Self Care | Attending: Internal Medicine

## 2023-05-21 ENCOUNTER — Emergency Department (HOSPITAL_COMMUNITY): Payer: Medicare Other

## 2023-05-21 ENCOUNTER — Other Ambulatory Visit: Payer: Self-pay

## 2023-05-21 ENCOUNTER — Encounter (HOSPITAL_COMMUNITY): Payer: Self-pay

## 2023-05-21 DIAGNOSIS — Z5901 Sheltered homelessness: Secondary | ICD-10-CM

## 2023-05-21 DIAGNOSIS — J029 Acute pharyngitis, unspecified: Principal | ICD-10-CM | POA: Diagnosis present

## 2023-05-21 DIAGNOSIS — I2510 Atherosclerotic heart disease of native coronary artery without angina pectoris: Secondary | ICD-10-CM | POA: Diagnosis present

## 2023-05-21 DIAGNOSIS — Z1152 Encounter for screening for COVID-19: Secondary | ICD-10-CM

## 2023-05-21 DIAGNOSIS — I200 Unstable angina: Secondary | ICD-10-CM | POA: Diagnosis not present

## 2023-05-21 DIAGNOSIS — I10 Essential (primary) hypertension: Secondary | ICD-10-CM | POA: Diagnosis present

## 2023-05-21 DIAGNOSIS — E785 Hyperlipidemia, unspecified: Secondary | ICD-10-CM | POA: Diagnosis present

## 2023-05-21 DIAGNOSIS — K219 Gastro-esophageal reflux disease without esophagitis: Secondary | ICD-10-CM | POA: Diagnosis present

## 2023-05-21 DIAGNOSIS — J028 Acute pharyngitis due to other specified organisms: Secondary | ICD-10-CM | POA: Diagnosis not present

## 2023-05-21 DIAGNOSIS — R9431 Abnormal electrocardiogram [ECG] [EKG]: Secondary | ICD-10-CM | POA: Diagnosis not present

## 2023-05-21 DIAGNOSIS — Z23 Encounter for immunization: Secondary | ICD-10-CM

## 2023-05-21 DIAGNOSIS — R0602 Shortness of breath: Secondary | ICD-10-CM | POA: Diagnosis not present

## 2023-05-21 DIAGNOSIS — Z720 Tobacco use: Secondary | ICD-10-CM

## 2023-05-21 DIAGNOSIS — F1011 Alcohol abuse, in remission: Secondary | ICD-10-CM | POA: Diagnosis present

## 2023-05-21 DIAGNOSIS — Z5982 Transportation insecurity: Secondary | ICD-10-CM

## 2023-05-21 DIAGNOSIS — E876 Hypokalemia: Secondary | ICD-10-CM | POA: Diagnosis present

## 2023-05-21 DIAGNOSIS — R0789 Other chest pain: Secondary | ICD-10-CM

## 2023-05-21 DIAGNOSIS — Z803 Family history of malignant neoplasm of breast: Secondary | ICD-10-CM

## 2023-05-21 DIAGNOSIS — F17210 Nicotine dependence, cigarettes, uncomplicated: Secondary | ICD-10-CM | POA: Diagnosis present

## 2023-05-21 DIAGNOSIS — Z885 Allergy status to narcotic agent status: Secondary | ICD-10-CM

## 2023-05-21 DIAGNOSIS — Z886 Allergy status to analgesic agent status: Secondary | ICD-10-CM

## 2023-05-21 HISTORY — DX: Atherosclerotic heart disease of native coronary artery without angina pectoris: I25.10

## 2023-05-21 LAB — CBC
HCT: 47.4 % (ref 39.0–52.0)
Hemoglobin: 16.7 g/dL (ref 13.0–17.0)
MCH: 33.6 pg (ref 26.0–34.0)
MCHC: 35.2 g/dL (ref 30.0–36.0)
MCV: 95.4 fL (ref 80.0–100.0)
Platelets: 192 10*3/uL (ref 150–400)
RBC: 4.97 MIL/uL (ref 4.22–5.81)
RDW: 12.2 % (ref 11.5–15.5)
WBC: 17.8 10*3/uL — ABNORMAL HIGH (ref 4.0–10.5)
nRBC: 0 % (ref 0.0–0.2)

## 2023-05-21 LAB — BRAIN NATRIURETIC PEPTIDE: B Natriuretic Peptide: 31.6 pg/mL (ref 0.0–100.0)

## 2023-05-21 LAB — MAGNESIUM: Magnesium: 2.1 mg/dL (ref 1.7–2.4)

## 2023-05-21 LAB — COMPREHENSIVE METABOLIC PANEL
ALT: 20 U/L (ref 0–44)
AST: 23 U/L (ref 15–41)
Albumin: 3.2 g/dL — ABNORMAL LOW (ref 3.5–5.0)
Alkaline Phosphatase: 55 U/L (ref 38–126)
Anion gap: 11 (ref 5–15)
BUN: 13 mg/dL (ref 6–20)
CO2: 25 mmol/L (ref 22–32)
Calcium: 9.4 mg/dL (ref 8.9–10.3)
Chloride: 99 mmol/L (ref 98–111)
Creatinine, Ser: 1.18 mg/dL (ref 0.61–1.24)
GFR, Estimated: >60 mL/min (ref >60)
Glucose, Bld: 93 mg/dL (ref 70–99)
Potassium: 3.3 mmol/L — ABNORMAL LOW (ref 3.5–5.1)
Sodium: 135 mmol/L (ref 135–145)
Total Bilirubin: 0.9 mg/dL (ref 0.3–1.2)
Total Protein: 7 g/dL (ref 6.5–8.1)

## 2023-05-21 LAB — HIV ANTIBODY (ROUTINE TESTING W REFLEX): HIV Screen 4th Generation wRfx: NONREACTIVE

## 2023-05-21 LAB — HEMOGLOBIN A1C
Hgb A1c MFr Bld: 5.1 % (ref 4.8–5.6)
Mean Plasma Glucose: 99.67 mg/dL

## 2023-05-21 LAB — LIPID PANEL
Cholesterol: 154 mg/dL (ref 0–200)
HDL: 74 mg/dL (ref >40)
LDL Cholesterol: 71 mg/dL (ref 0–99)
Total CHOL/HDL Ratio: 2.1 {ratio}
Triglycerides: 47 mg/dL (ref <150)
VLDL: 9 mg/dL (ref 0–40)

## 2023-05-21 LAB — SARS CORONAVIRUS 2 BY RT PCR: SARS Coronavirus 2 by RT PCR: NEGATIVE

## 2023-05-21 LAB — GROUP A STREP BY PCR: Group A Strep by PCR: NOT DETECTED

## 2023-05-21 LAB — TROPONIN I (HIGH SENSITIVITY)
Troponin I (High Sensitivity): 12 ng/L (ref <18)
Troponin I (High Sensitivity): 11 ng/L (ref <18)

## 2023-05-21 LAB — LIPASE, BLOOD: Lipase: 24 U/L (ref 11–51)

## 2023-05-21 SURGERY — LEFT HEART CATH AND CORONARY ANGIOGRAPHY
Anesthesia: LOCAL

## 2023-05-21 MED ORDER — SENNOSIDES-DOCUSATE SODIUM 8.6-50 MG PO TABS: 1.0000 | ORAL_TABLET | Freq: Every evening | ORAL | Status: DC | PRN

## 2023-05-21 MED ORDER — ACETAMINOPHEN 650 MG RE SUPP: 650.0000 mg | Freq: Four times a day (QID) | RECTAL | Status: DC | PRN

## 2023-05-21 MED ORDER — ACETAMINOPHEN 325 MG PO TABS
650.0000 mg | ORAL_TABLET | Freq: Four times a day (QID) | ORAL | Status: DC | PRN
  Administered 2023-05-22: 650 mg via ORAL
  Filled 2023-05-21: qty 2

## 2023-05-21 MED ORDER — PHENOL 1.4 % MT LIQD
1.0000 | OROMUCOSAL | Status: DC | PRN
  Administered 2023-05-21 – 2023-05-22 (×2): 1 via OROMUCOSAL
  Filled 2023-05-21: qty 177

## 2023-05-21 MED ORDER — ENOXAPARIN SODIUM 40 MG/0.4ML IJ SOSY
40.0000 mg | PREFILLED_SYRINGE | INTRAMUSCULAR | Status: DC
  Administered 2023-05-21 – 2023-05-22 (×2): 40 mg via SUBCUTANEOUS
  Filled 2023-05-21 (×2): qty 0.4

## 2023-05-21 MED ORDER — POTASSIUM CHLORIDE 10 MEQ/100ML IV SOLN
10.0000 meq | Freq: Once | INTRAVENOUS | Status: AC
  Administered 2023-05-21: 10 meq via INTRAVENOUS

## 2023-05-21 MED ORDER — POTASSIUM CHLORIDE 10 MEQ/100ML IV SOLN
10.0000 meq | INTRAVENOUS | Status: AC
  Administered 2023-05-21 (×3): 10 meq via INTRAVENOUS
  Filled 2023-05-21 (×4): qty 100

## 2023-05-21 MED ORDER — INFLUENZA VIRUS VACC SPLIT PF (FLUZONE) 0.5 ML IM SUSY
0.5000 mL | PREFILLED_SYRINGE | INTRAMUSCULAR | Status: AC
  Administered 2023-05-22: 0.5 mL via INTRAMUSCULAR
  Filled 2023-05-21: qty 0.5

## 2023-05-21 NOTE — Consult Note (Signed)
Cardiology Consultation   Patient ID: GARO HEIDELBERG MRN: [id number]; DOB: [birth year]  Admit date: 05/21/2023 Date of Consult: 05/21/2023  PCP:  Storm Frisk, MD   [facility] Providers Cardiologist:  None   {   Patient Profile:   Jose Rush is a 61 y.o. male with a hx of homelessness, GERD, cervical radiculopathy, tobacco abuse, remote alcohol abuse, who is being seen 05/21/2023 for the evaluation of chest pain at the request of Dr. Jearld Fenton.  History of Present Illness:   Jose Rush with above past medical history presented to the ER today with numerous complaints including sore throat, generalized weakness, and shortness of breath over the past 3 days.  Yesterday he started having intermittent chest pain.  He was also seen by ER on 05/19/2023 for sore throat and myalgia that was felt due to viral illness. He states he continue to have all over body ache. He felts his neck is swollen with nodules and he can swallow anything. He felt his entire chest with pressure since yesterday while resting in bed. He is not able to eat or drink due to sense of neck swelling and chest pressure. He states he can't breath adequately. He has a mild cough, no fever. He smokes 1 pack cigarettes every 2 days, states has not drink ETOH for a while, denied illicit drug use. He denied any known family hx of cardiac disease. He denied any bleeding.   Diagnostic workup today revealed hypokalemia 3.3, albumin 3.2, otherwise unremarkable CMP.  BNP 31.6.  High sensitive troponin 12 >11.  CBC with leukocytosis 17800.  05/19/2023 respiratory viral panel negative for COVID and flu and RSV.  Group A strep PCR negative today.  Chest x-ray without acute finding. EKG showed sinus rhythm with anterior Q, nonspecific ST abnormality, no previous EKG available to compare.  He is admitted to medicine service, cardiology is consulted for chest pain concerning for unstable angina.  Per records review, patient  has no previous history of cardiac disease.  He had alcohol abuse history in 2018.     Past Medical History:  Diagnosis Date   GERD (gastroesophageal reflux disease)     Past Surgical History:  Procedure Laterality Date   TONSILLECTOMY       Home Medications:  Prior to Admission medications   Medication Sig Start Date End Date Taking? Authorizing Provider  acetaminophen (TYLENOL) 500 MG tablet Take 1 tablet (500 mg total) by mouth every 8 (eight) hours as needed. 05/19/23  Yes Anders Simmonds T, DO  Omeprazole Magnesium (PRILOSEC PO) Take by mouth. For ulcer   Yes [provider]  PRESCRIPTION MEDICATION Pt states he takes a cholesterol medication, but can't remember the name or strength.   Yes [provider]    Inpatient Medications: Scheduled Meds:  [START ON 05/22/2023] influenza vac split trivalent PF  0.5 mL Intramuscular Tomorrow-1000   Continuous Infusions:  PRN Meds:   Allergies:    Allergies  Allergen Reactions   Asa [Aspirin] Nausea And Vomiting   Codeine Nausea And Vomiting    Social History:   Social History   Socioeconomic History   Marital status: Married    Spouse name: Not on file   Number of children: Not on file   Years of education: Not on file   Highest education level: Not on file  Occupational History   Not on file  Tobacco Use   Smoking status: Every Day    Current packs/day: 1.00  Types: Cigarettes   Smokeless tobacco: Never  Substance and Sexual Activity   Alcohol use: Yes    Comment: occ   Drug use: Yes    Types: Marijuana   Sexual activity: Not on file  Other Topics Concern   Not on file  Social History Narrative   Not on file   Social Determinants of Health   Financial Resource Strain: Not on file  Food Insecurity: No Food Insecurity (05/21/2023)   Hunger Vital Sign    Worried About Running Out of Food in the Last Year: Never true    Ran Out of Food in the Last Year: Never true  Transportation  Needs: Unmet Transportation Needs (05/21/2023)   PRAPARE - Administrator, Civil Service (Medical): Yes    Lack of Transportation (Non-Medical): Yes  Physical Activity: Not on file  Stress: Not on file  Social Connections: Not on file  Intimate Partner Violence: Patient Declined (05/21/2023)   Humiliation, Afraid, Rape, and Kick questionnaire    Fear of Current or Ex-Partner: Patient declined    Emotionally Abused: Patient declined    Physically Abused: Patient declined    Sexually Abused: Patient declined    Family History:   History reviewed. No pertinent family history.   ROS:  Constitutional: Denied fever, chills, malaise, night sweats Eyes: Denied vision change or loss Ears/Nose/Mouth/Throat: See HPI Cardiovascular: see HPI  Respiratory: see HPI  Gastrointestinal: Denied nausea, vomiting, abdominal pain, diarrhea Genital/Urinary: Denied dysuria, hematuria, urinary frequency/urgency Musculoskeletal: muscle ache Skin: Denied rash, wound Neuro: Denied headache, dizziness, syncope Psych: Denied history of depression/anxiety  Endocrine: Denied history of diabetes   Physical Exam/Data:   Vitals:   05/21/23 1130 05/21/23 1200 05/21/23 1415 05/21/23 1501  BP: 123/85 120/81 107/75   Pulse: 76 76 65   Resp: 19 18 16    Temp:    98.3 F (36.8 C)  SpO2: 97% 95% 95%   Weight:      Height:       No intake or output data in the 24 hours ending 05/21/23 1611    05/21/2023   10:34 AM 01/29/2023   11:00 AM 01/01/2023   11:00 AM  Last 3 Weights  Weight (lbs) 185 lb 161 lb 160 lb 6.4 oz  Weight (kg) 83.915 kg 73.029 kg 72.757 kg     Body mass index is 27.32 kg/m.   Vitals:  Vitals:   05/21/23 1415 05/21/23 1501  BP: 107/75   Pulse: 65   Resp: 16   Temp:  98.3 F (36.8 C)  SpO2: 95%    General Appearance: In no apparent distress, laying in bed HEENT: Normocephalic, atraumatic.  Neck: Supple, trachea midline, no JVDs Cardiovascular: Regular rate and  rhythm, normal S1-S2,  no murmur, chest wall tenderness +  Respiratory: Resting breathing unlabored, lungs sounds clear to auscultation bilaterally, no use of accessory muscles. On room air.  No wheezes, rales or rhonchi.   Gastrointestinal: Bowel sounds positive, abdomen soft, non-tender, non-distended.  Extremities: Able to move all extremities in bed without difficulty, no edema of BLE Musculoskeletal: Normal muscle bulk and tone Skin: Intact, warm, dry. No rashes or petechiae noted in exposed areas.  Neurologic: Alert, oriented to person, place and time. no gross focal neuro deficit Psychiatric: Normal affect. Mood is appropriate.    EKG:  The EKG was personally reviewed and demonstrates:    EKG showed sinus rhythm, anterior Q, non specific ST abnormalities   Telemetry:  Telemetry was personally reviewed and  demonstrates:    Sinus rhythm    Relevant CV Studies: N/A  Laboratory Data:  High Sensitivity Troponin:   Recent Labs  Lab 05/21/23 1102 05/21/23 1256  TROPONINIHS 12 11     Chemistry Recent Labs  Lab 05/21/23 1102  NA 135  K 3.3*  CL 99  CO2 25  GLUCOSE 93  BUN 13  CREATININE 1.18  CALCIUM 9.4  GFRNONAA >60  ANIONGAP 11    Recent Labs  Lab 05/21/23 1102  PROT 7.0  ALBUMIN 3.2*  AST 23  ALT 20  ALKPHOS 55  BILITOT 0.9   Lipids No results for input(s): "CHOL", "TRIG", "HDL", "LABVLDL", "LDLCALC", "CHOLHDL" in the last 168 hours.  Hematology Recent Labs  Lab 05/21/23 1102  WBC 17.8*  RBC 4.97  HGB 16.7  HCT 47.4  MCV 95.4  MCH 33.6  MCHC 35.2  RDW 12.2  PLT 192   Thyroid No results for input(s): "TSH", "FREET4" in the last 168 hours.  BNP Recent Labs  Lab 05/21/23 1102  BNP 31.6    DDimer No results for input(s): "DDIMER" in the last 168 hours.   Radiology/Studies:  DG Chest Port 1 View  Result Date: 05/21/2023 CLINICAL DATA:  Chest pain EXAM: PORTABLE CHEST 1 VIEW COMPARISON:  12/15/2015 FINDINGS: Artifact overlies the chest.  Heart and mediastinal shadows are normal. The lungs are clear. No infiltrate, collapse or effusion. No abnormal bone finding. IMPRESSION: No active disease. Electronically Signed   By: Paulina Fusi M.D.   On: 05/21/2023 14:31     Assessment and Plan:   Chest pain - presented with sore throat, sensation of neck swelling, unable swallow PO intake, SOB, chest pain, all body ache, malaise for 3 days  - chest wall tenderness + on palpation, subjective pain reproducible  - Hs trop 12>11, negative for ACS  - EKG of unclear significance, no old EKG to compare - will check lipid, A1C, Echo - suspect this is viral URI related symptoms, doubt CAD, gated CT if Echo abnormal  - check UDS  Tobacco use - recommend smoking cessation     Risk Assessment/Risk Scores:        For questions or updates, please contact [facility] Please consult [email or web address] for contact info under    Signed, Cyndi Bender, NP  05/21/2023 4:11 PM

## 2023-05-21 NOTE — H&P (Cosign Needed)
Date: 05/22/2023               Patient Name:  Jose Rush MRN: [id number]  DOB: 1961/08/02 Age / Sex: 62 y.o., male   PCP: Storm Frisk, MD         Medical Service: Internal Medicine Teaching Service         Attending Physician: Dr. Reymundo Poll, MD      First Contact: Dr. Morrie Sheldon [phone]    Second Contact: Dr. Rana Snare  [phone]         After Hours (After 5p/  First Contact Pager: [phone]  weekends / holidays): Second Contact Pager: [phone]   SUBJECTIVE   Chief Complaint: Sore throat and shortness of breath   History of Present Illness: Jose Rush is a 62 year old male with a past medical history of cervical disc disorder with radiculopathy of cervical region and tobacco use disorder who presented to the ED with 3 days of sore throat, generalized weakness, and shortness of breath and was admitted with concern for unstable angina.  Patient was seen in the ED 2 days ago for similar signs and symptoms which were thought to be viral in nature.  Labs at that time were not ordered due to the patient's reassuring vital signs and physical examination.  No imaging done for the patient at that time for further workup of deep space infection in the neck as the patient had no swelling of the mouth or oropharynx, no palpable mass in the neck, and no airway compromise.  Patient presented today where he reported worsening shortness of breath, sore throat, chest pain, and generalized weakness. He has been unable to tolerate food or liquids. He described the chest pain as crushing in nature that is worse with exertion and improves with rest. He has not experienced pain like this before, but it is improving. He has also had nasal congestion during this time but denies any cough, nausea, or vomiting. He also endorsed subjective fever, chills, loose stools, and headaches. He denied any sick contacts or recent travel.   ED Course: Previously seen in ED on 05/19/2023 for  similar symptoms but he did not receive labs at the time.,  CBC notable for leukocytosis to 17.8.  Troponins within normal limits.  Potassium decreased at 3.3.  BNP within normal limits.  Lipase within normal limits.  COVID and group A strep negative.  EKG notable for inferior ST depressions and anterior elevations and Q waves.  No prior comparison.  Patient has been followed by cardiology.  Meds:  Current Meds  Medication Sig   acetaminophen (TYLENOL) 500 MG tablet Take 1 tablet (500 mg total) by mouth every 8 (eight) hours as needed.   Omeprazole Magnesium (PRILOSEC PO) Take by mouth. For ulcer   PRESCRIPTION MEDICATION Pt states he takes a cholesterol medication, but can't remember the name or strength.  -Denies taking any medications currently   Past Medical History Hypertension  Hyperlipidemia   Past Surgical History Past Surgical History:  Procedure Laterality Date   TONSILLECTOMY      Social:  Lives With: Homeless  Support: None  Level of Function: independent  PCP: None  Substances: tobacco 45/69 ppd x 62 year old, denies alcohol, denies recreational drug  Family History:  -Mother had breast cancer   Allergies: Allergies as of 05/21/2023 - Review Complete 05/21/2023  Allergen Reaction Noted   Asa [aspirin] Nausea And Vomiting 08/20/2015   Codeine Nausea And Vomiting 08/20/2015    Review  of Systems: A complete ROS was negative except as per HPI.   OBJECTIVE:   Physical Exam: Blood pressure 120/81, pulse 76, temperature 98.5 F (36.9 C), resp. rate 18, height 5\' 9"  (1.753 m), weight 83.9 kg, SpO2 95%.  Constitutional: well-appearing sitting in no acute distress HENT: normocephalic atraumatic, mucous membranes moist; throat mildly erythematous Eyes: conjunctiva non-erythematous Neck: supple, tender but no palpable lymph nodes or nodules Cardiovascular: regular rate and rhythm, no m/r/g Pulmonary/Chest: normal work of breathing on room air, lungs clear to  auscultation bilaterally Abdominal: soft, non-tender, non-distended MSK: normal bulk and tone Neurological: alert & oriented x 3, 5/5 strength in bilateral upper and lower extremities, normal gait Skin: warm and dry  Labs: CBC    Component Value Date/Time   WBC 14.6 (H) 05/22/2023 0208   RBC 4.52 05/22/2023 0208   HGB 14.9 05/22/2023 0208   HCT 42.6 05/22/2023 0208   PLT 193 05/22/2023 0208   MCV 94.2 05/22/2023 0208   MCH 33.0 05/22/2023 0208   MCHC 35.0 05/22/2023 0208   RDW 11.9 05/22/2023 0208   LYMPHSABS 1.7 07/25/2009 2202   MONOABS 0.7 07/25/2009 2202   EOSABS 0.2 07/25/2009 2202   BASOSABS 0.1 07/25/2009 2202     CMP     Component Value Date/Time   NA 133 (L) 05/22/2023 0208   K 5.1 05/22/2023 0208   CL 99 05/22/2023 0208   CO2 25 05/22/2023 0208   GLUCOSE 98 05/22/2023 0208   BUN 18 05/22/2023 0208   CREATININE 1.06 05/22/2023 0208   CALCIUM 9.4 05/22/2023 0208   PROT 7.0 05/21/2023 1102   ALBUMIN 3.2 (L) 05/21/2023 1102   AST 23 05/21/2023 1102   ALT 20 05/21/2023 1102   ALKPHOS 55 05/21/2023 1102   BILITOT 0.9 05/21/2023 1102   GFRNONAA >60 05/22/2023 0208   GFRAA  07/25/2009 2202    >60        The eGFR has been calculated using the MDRD equation. This calculation has not been validated in all clinical situations. eGFR's persistently <60 mL/min signify possible Chronic Kidney Disease.    Imaging:  DG Chest Port 1 Artifact overlies the chest. Heart and mediastinal shadows are normal. The lungs are clear. No infiltrate, collapse or effusion. No abnormal bone finding.  EKG: personally reviewed my interpretation is normal sinus with minimal inferior ST depressions. Prior EKG not available.   ASSESSMENT & PLAN:   Assessment & Plan by Problem: Principal Problem:   Unstable angina (HCC)   Kanishk Stroebel is a 62 year old male with a past medical history of cervical disc disorder with radiculopathy of cervical region and tobacco use disorder who  presented to the ED with 3 days of sore throat, generalized weakness, and shortness of breath  #Shortness of breath #Chest pain c/f Unstable Angina  Patient notes that chest pain began early in the morning started before he woke up.  It has been intermittent but he does not have active chest pain.  Denies any history of similar chest pain.  Troponins within normal limits.  BNP within normal limits.  EKG notable for mild ST depressions.  Seen by cards in the ED. No plans for cath lab at this time. The ST depressions plus negative tropes are highly suspicious for unstable angina. NSTEMI possible due to ST depressions but less likely given negative tropes.  STEMI less likely due to absence of ST elevations and negative tropes.  Seen by cardiology who noted the chest wall tenderness was reproducible on palpitation.  They will check his lipid levels, A1c, and echo but suspect this is a viral upper respiratory tract infection. - Follow-up lipid, A1c, echo  #Viral Pharyngitis #Leukocytosis #Generalized Myalgias  Patient noted have had a 3-day history of sore throat with difficulty swallowing fluids and solids.  On assessment, cannot appreciate any bilateral tonsillar exudates but noted on ED assessment.  Oropharynx was erythematous.  No submandibular swelling or palpable lymph nodes.  Negative for strep and COVID.  Presentation likely viral in nature.  At this moment, we have a low concern for an abscess as the patient has remained afebrile despite endorsing subjective fever and chills.  If patient spikes a fever, low threshold for imaging.  His generalized weakness is likely secondary to his poor p.o. intake over the past few days. - Trend fever curve and CBC  #Hypokalemia Noted to be 3.3 on admission.  Replenish and we will continue to monitor electrolytes.  - Trend BMP  Chronic Conditions #Social Determinants of Health  Patient currently homeless and lives under bridge.  Noted to have been released  from prison 1 year ago.  #Hypertension Not currently medicated and has not taken any medications in the past.  Diet: NPO VTE: Enoxaparin IVF: None,None Code: Full  Prior to Admission Living Arrangement: Homeless Anticipated Discharge Location:  TBD Barriers to Discharge: Resolution of chest pain   Dispo: Admit patient to Observation with expected length of stay less than 2 midnights.  Signed: Morrie Sheldon, MD Internal Medicine Resident PGY-1  05/22/2023, 12:57 PM

## 2023-05-21 NOTE — ED Triage Notes (Signed)
Pt arrives via EMS from a shelter. Pt was seen at a mobile care unit. Pt reports sob for the past 3 days, sore throat, and generalized weakness. Pt was given 1 duoneb and 125mg  of solumedrol. Reports he had chest pain yesterday. Pt is AxOx4.

## 2023-05-21 NOTE — H&P (Incomplete)
  Date: 05/21/2023               Patient Name:  Jose Rush MRN: [id number]  DOB: Nov 29, 1960 Age / Sex: 62 y.o., male   PCP: Storm Frisk, MD              Medical Service: Internal Medicine Teaching Service              Attending Physician: Dr. Loetta Rough, MD    First Contact: ***, MS *** Pager: [phone]  Second Contact: Dr. Marland Kitchen Pager: [phone]***  Third Contact Dr. Marland Kitchen Pager: [phone]       After Hours (After 5p/  First Contact Pager: [phone]  weekends / holidays): Second Contact Pager: [phone]   Chief Complaint: ***  History of Present Illness: ***  ED Course: Seen 2 days ago diagnosed with Viral pharyngitis Strep swab pending CXR no consolidation prelim read Yesterday morning squeezing CP and SOB. 1st troponin negative. EKG: Elevation anteriorly, depression inferiorly Cardiology consult (Dr. Rosemary Holms) said they'll follow and patient is scheduled for cath for 3:15 pm.  Meds: *** Current Meds  Medication Sig   acetaminophen (TYLENOL) 500 MG tablet Take 1 tablet (500 mg total) by mouth every 8 (eight) hours as needed.   Omeprazole Magnesium (PRILOSEC PO) Take by mouth. For ulcer   PRESCRIPTION MEDICATION Pt states he takes a cholesterol medication, but can't remember the name or strength.     Allergies: Allergies as of 05/21/2023 - Review Complete 05/21/2023  Allergen Reaction Noted   Asa [aspirin] Nausea And Vomiting 08/20/2015   Codeine Nausea And Vomiting 08/20/2015   Past Medical History:  Diagnosis Date   GERD (gastroesophageal reflux disease)     Family History: ***  Social History: ***  Review of Systems: A complete ROS was negative except as per HPI. ***  Physical Exam: Blood pressure 120/81, pulse 76, temperature 98.5 F (36.9 C), resp. rate 18, height 5\' 9"  (1.753 m), weight 83.9 kg, SpO2 95%. ***  EKG: personally reviewed my interpretation is***  CXR: personally reviewed my interpretation is***  Assessment & Plan by  Problem: Active Problems:   * No active hospital problems. *   Diet: {NAMES:3044014::"Normal","Heart Healthy","Carb-Modified","Renal","Carb/Renal","NPO","TPN","Tube Feeds"} IVF: {NAMES:3044014::"None","NS","1/2 NS","LR","D5","D10"},{NAMES:3044014::"None","10cc/hr","25cc/hr","50cc/hr","75cc/hr","100cc/hr","110cc/hr","125cc/hr","Bolus"} VTE: {NAMES:3044014::"Heparin","Enoxaparin","SCDs","DOAC","None"} Code: {NAMES:3044014::"Full","DNR","DNI","DNR/DNI","Comfort Care","Unknown"}  Dispo: Admit patient to {STATUS:3044014::"Observation with expected length of stay less than 2 midnights.","Inpatient with expected length of stay greater than 2 midnights."}  Signed: Kayleen Memos, Medical Student 05/21/2023, 1:47 PM

## 2023-05-21 NOTE — ED Provider Notes (Signed)
Jose Rush EMERGENCY DEPARTMENT AT [facility] Provider Note   CSN: [id number] Arrival date & time: 05/21/23  1029     History  Chief Complaint  Patient presents with   Shortness of Breath   Sore Throat    Jose Rush is a 62 y.o. male with PMH as listed below who presents to ED for SOB/CP. He was seen at a mobile care unit. Pt reports sob for the past 3 days, sore throat, and generalized weakness. Pt was given 1 duoneb and 125mg  of solumedrol by EMS. Reports he had chest pain yesterday early in the AM that started before he got up. CP is intermittent, does not have active CP. A/w nausea/vomiting/diarrhea. No h/o similar. He endorses bilateral sore throat, productive cough, and fatigue/myalgias x 4 days. No hemoptysis, f/c that he knows of. Lives in shelter, no known sick contacts.  Per chart review patient was seen on 05/19/2023 for sore throat and myalgias, symptoms were consistent with a viral syndrome.  Past Medical History:  Diagnosis Date   GERD (gastroesophageal reflux disease)        Home Medications Prior to Admission medications   Medication Sig Start Date End Date Taking? Authorizing Provider  acetaminophen (TYLENOL) 500 MG tablet Take 1 tablet (500 mg total) by mouth every 8 (eight) hours as needed. 05/19/23  Yes Anders Simmonds T, DO  Omeprazole Magnesium (PRILOSEC PO) Take by mouth. For ulcer   Yes [provider]  PRESCRIPTION MEDICATION Pt states he takes a cholesterol medication, but can't remember the name or strength.   Yes [provider]      Allergies    Asa [aspirin] and Codeine    Review of Systems   Review of Systems A 10 point review of systems was performed and is negative unless otherwise reported in HPI.  Physical Exam Updated Vital Signs BP 120/81   Pulse 76   Temp 98.5 F (36.9 C)   Resp 18   Ht 5\' 9"  (1.753 m)   Wt 83.9 kg   SpO2 95%   BMI 27.32 kg/m  Physical Exam General: Normal appearing male,  lying in bed.  HEENT: PERRLA, EOMI, NCAT, Sclera anicteric, MMM, trachea midline. Oropharynx is erythematous w/ bilateral tonsillar exudates. Uvula hands in midline. No submandibular swelling. Cardiology: RRR, no murmurs/rubs/gallops. BL radial and DP pulses equal bilaterally.  Resp: Normal respiratory rate and effort. CTAB, no wheezes, rhonchi, crackles.  Abd: Soft, non-tender, non-distended. No rebound tenderness or guarding.  GU: Deferred. MSK: No peripheral edema or signs of trauma. Extremities without deformity or TTP. No cyanosis or clubbing. Skin: warm, dry.  Neuro: A&Ox4, CNs II-XII grossly intact. MAEs. Sensation grossly intact.  Psych: Normal mood and affect.   ED Results / Procedures / Treatments   Labs (all labs ordered are listed, but only abnormal results are displayed) Labs Reviewed  CBC - Abnormal; Notable for the following components:      Result Value   WBC 17.8 (*)    All other components within normal limits  COMPREHENSIVE METABOLIC PANEL - Abnormal; Notable for the following components:   Potassium 3.3 (*)    Albumin 3.2 (*)    All other components within normal limits  SARS CORONAVIRUS 2 BY RT PCR  GROUP A STREP BY PCR  BRAIN NATRIURETIC PEPTIDE  LIPASE, BLOOD  TROPONIN I (HIGH SENSITIVITY)  TROPONIN I (HIGH SENSITIVITY)    EKG EKG Interpretation Date/Time:  Wednesday May 21 2023 10:35:04 EDT Ventricular Rate:  87  PR Interval:  161 QRS Duration:  80 QT Interval:  352 QTC Calculation: 424 R Axis:   134  Text Interpretation: Sinus rhythm Anterior infarct, old No prior for comparison Confirmed by Vivi Barrack [phone]) on 05/21/2023 10:55:30 AM  Radiology DG Chest Port 1 View  Result Date: 05/21/2023 CLINICAL DATA:  Chest pain EXAM: PORTABLE CHEST 1 VIEW COMPARISON:  12/15/2015 FINDINGS: Artifact overlies the chest. Heart and mediastinal shadows are normal. The lungs are clear. No infiltrate, collapse or effusion. No abnormal bone finding.  IMPRESSION: No active disease. Electronically Signed   By: Paulina Fusi M.D.   On: 05/21/2023 14:31    Procedures Procedures    Medications Ordered in ED Medications  influenza vac split trivalent PF (FLULAVAL) injection 0.5 mL (has no administration in time range)    ED Course/ Medical Decision Making/ A&P                          Medical Decision Making Amount and/or Complexity of Data Reviewed Labs: ordered. Decision-making details documented in ED Course. Radiology: ordered.  Risk Prescription drug management. Decision regarding hospitalization.    This patient presents to the ED for concern of SOB/CP, viral sxs; this involves an extensive number of treatment options, and is a complaint that carries with it a high risk of complications and morbidity.  I considered the following differential and admission for this acute, potentially life threatening condition.   MDM:    DDX for chest pain/SOB includes but is not limited to: Patient does have EKG changes including elevations in anterior leads.  Will discuss with cardiology.  Patient also has had viral symptoms, must consider pericarditis/myocarditis as well though patient does not have diffuse ST elevations but rather does have some inferior ST depressions. Patient cannot PERC out based on age, but no sxs/signs c/w DVT on exam and has minimal risk factors for PE. Lower c/f dissection. W/ Nausea/vomiting/diarrhea consider pancreatitis, will get lipase. No hemoptysis/hematemesis to suggest mallory weiss tear/boerhaaves syndrome. Consider GERD/gastritis, given known history. Consider viral syndrome, bronchitis, viral pharyngitis, or strep throat for URI sxs. Does have tonsillar exudates. No PTA, RPA, or ludwigs angina now. Received solumedrol/duoneb from EMS, unclear if he had wheezing then, does not have wheezing now and no documented h/o COPD but if he was wheezing could be having COPD exacerbation. CXR doesn't show PNA, PTX, pulm  edema, or pleural effusion.    Clinical Course as of 05/21/23 1457  Wed May 21, 2023  1153 Repeat EKG w/ continued, possibly mildly worsened, inferior ST depressions and anterior elevations and Q waves. No prior for comparison. D/w Dr. Rosemary Holms on STEMI call and given that patient's pain started yesterday AM, with no active CP, will not take to cath at this moment but will likely need cath sooner rather than later and an admission. Cardiology will consult and come to see the patient. [HN]  1304 WBC(!): 17.8 +Leukocytosis [HN]  1329 Lipase: 24 neg [HN]  1329 B Natriuretic Peptide: 31.6 neg [HN]  1329 Comprehensive metabolic panel(!) Unremarkable in the context of this patient's presentation  [HN]  1457 Group A Strep by PCR: NOT DETECTED [HN]  1457 Admitted to hospitalist. [HN]    Clinical Course User Index [HN] Loetta Rough, MD    Labs: I Ordered, and personally interpreted labs.  The pertinent results include:  those listed above  Imaging Studies ordered: I ordered imaging studies including CXR I independently visualized and interpreted imaging.  I agree with the radiologist interpretation  Additional history obtained from chart review.    Cardiac Monitoring: The patient was maintained on a cardiac monitor.  I personally viewed and interpreted the cardiac monitored which showed an underlying rhythm of: NSR  Social Determinants of Health: Homeless  Disposition:  Admitted to hospitalist w/ cards following  Co morbidities that complicate the patient evaluation  Past Medical History:  Diagnosis Date   GERD (gastroesophageal reflux disease)      Medicines Meds ordered this encounter  Medications   influenza vac split trivalent PF (FLULAVAL) injection 0.5 mL    I have reviewed the patients home medicines and have made adjustments as needed  Problem List / ED Course: Problem List Items Addressed This Visit   None Visit Diagnoses     Viral pharyngitis    -   Primary   Relevant Medications   influenza vac split trivalent PF (FLULAVAL) injection 0.5 mL (Start on 05/22/2023 10:00 AM)   Chest pain, atypical                       This note was created using dictation software, which may contain spelling or grammatical errors.    Loetta Rough, MD 05/21/23 [phone]

## 2023-05-22 ENCOUNTER — Inpatient Hospital Stay (HOSPITAL_COMMUNITY): Payer: Medicare Other

## 2023-05-22 ENCOUNTER — Observation Stay (HOSPITAL_COMMUNITY): Payer: Medicare Other

## 2023-05-22 DIAGNOSIS — Z886 Allergy status to analgesic agent status: Secondary | ICD-10-CM | POA: Diagnosis not present

## 2023-05-22 DIAGNOSIS — F17210 Nicotine dependence, cigarettes, uncomplicated: Secondary | ICD-10-CM | POA: Diagnosis present

## 2023-05-22 DIAGNOSIS — R931 Abnormal findings on diagnostic imaging of heart and coronary circulation: Secondary | ICD-10-CM

## 2023-05-22 DIAGNOSIS — I2510 Atherosclerotic heart disease of native coronary artery without angina pectoris: Secondary | ICD-10-CM | POA: Diagnosis not present

## 2023-05-22 DIAGNOSIS — Z803 Family history of malignant neoplasm of breast: Secondary | ICD-10-CM | POA: Diagnosis not present

## 2023-05-22 DIAGNOSIS — I10 Essential (primary) hypertension: Secondary | ICD-10-CM | POA: Diagnosis present

## 2023-05-22 DIAGNOSIS — Z885 Allergy status to narcotic agent status: Secondary | ICD-10-CM | POA: Diagnosis not present

## 2023-05-22 DIAGNOSIS — R079 Chest pain, unspecified: Secondary | ICD-10-CM | POA: Diagnosis not present

## 2023-05-22 DIAGNOSIS — Z23 Encounter for immunization: Secondary | ICD-10-CM | POA: Diagnosis present

## 2023-05-22 DIAGNOSIS — Z1152 Encounter for screening for COVID-19: Secondary | ICD-10-CM | POA: Diagnosis not present

## 2023-05-22 DIAGNOSIS — Z5901 Sheltered homelessness: Secondary | ICD-10-CM | POA: Diagnosis not present

## 2023-05-22 DIAGNOSIS — K219 Gastro-esophageal reflux disease without esophagitis: Secondary | ICD-10-CM | POA: Diagnosis present

## 2023-05-22 DIAGNOSIS — I200 Unstable angina: Secondary | ICD-10-CM | POA: Diagnosis present

## 2023-05-22 DIAGNOSIS — E876 Hypokalemia: Secondary | ICD-10-CM | POA: Diagnosis present

## 2023-05-22 DIAGNOSIS — J028 Acute pharyngitis due to other specified organisms: Secondary | ICD-10-CM | POA: Diagnosis present

## 2023-05-22 DIAGNOSIS — R0602 Shortness of breath: Secondary | ICD-10-CM | POA: Diagnosis present

## 2023-05-22 DIAGNOSIS — E785 Hyperlipidemia, unspecified: Secondary | ICD-10-CM | POA: Diagnosis present

## 2023-05-22 DIAGNOSIS — F1011 Alcohol abuse, in remission: Secondary | ICD-10-CM | POA: Diagnosis present

## 2023-05-22 DIAGNOSIS — Z5982 Transportation insecurity: Secondary | ICD-10-CM | POA: Diagnosis not present

## 2023-05-22 LAB — BASIC METABOLIC PANEL
Anion gap: 9 (ref 5–15)
BUN: 18 mg/dL (ref 6–20)
CO2: 25 mmol/L (ref 22–32)
Calcium: 9.4 mg/dL (ref 8.9–10.3)
Chloride: 99 mmol/L (ref 98–111)
Creatinine, Ser: 1.06 mg/dL (ref 0.61–1.24)
GFR, Estimated: >60 mL/min (ref >60)
Glucose, Bld: 98 mg/dL (ref 70–99)
Potassium: 5.1 mmol/L (ref 3.5–5.1)
Sodium: 133 mmol/L — ABNORMAL LOW (ref 135–145)

## 2023-05-22 LAB — ECHOCARDIOGRAM COMPLETE
AR max vel: 3.83 cm2
AV Area VTI: 4.76 cm2
AV Area mean vel: 3.99 cm2
AV Mean grad: 6 mm[Hg]
AV Peak grad: 10.8 mm[Hg]
Ao pk vel: 1.64 m/s
Area-P 1/2: 3.12 cm2
Height: 69 in
S' Lateral: 2.4 cm
Weight: 2960 [oz_av]

## 2023-05-22 LAB — CBC
HCT: 42.6 % (ref 39.0–52.0)
Hemoglobin: 14.9 g/dL (ref 13.0–17.0)
MCH: 33 pg (ref 26.0–34.0)
MCHC: 35 g/dL (ref 30.0–36.0)
MCV: 94.2 fL (ref 80.0–100.0)
Platelets: 193 10*3/uL (ref 150–400)
RBC: 4.52 MIL/uL (ref 4.22–5.81)
RDW: 11.9 % (ref 11.5–15.5)
WBC: 14.6 10*3/uL — ABNORMAL HIGH (ref 4.0–10.5)
nRBC: 0 % (ref 0.0–0.2)

## 2023-05-22 LAB — SEDIMENTATION RATE: Sed Rate: 35 mm/h — ABNORMAL HIGH (ref 0–16)

## 2023-05-22 MED ORDER — NITROGLYCERIN 0.4 MG SL SUBL
SUBLINGUAL_TABLET | SUBLINGUAL | Status: AC
  Filled 2023-05-22: qty 2

## 2023-05-22 MED ORDER — SALINE SPRAY 0.65 % NA SOLN: 1.0000 | NASAL | Status: DC | PRN

## 2023-05-22 MED ORDER — FLUTICASONE PROPIONATE 50 MCG/ACT NA SUSP
2.0000 | Freq: Every day | NASAL | Status: DC
  Administered 2023-05-22 – 2023-05-23 (×2): 2 via NASAL
  Filled 2023-05-22: qty 16

## 2023-05-22 MED ORDER — ENSURE ENLIVE PO LIQD
237.0000 mL | Freq: Two times a day (BID) | ORAL | Status: DC
  Administered 2023-05-22 – 2023-05-23 (×3): 237 mL via ORAL

## 2023-05-22 MED ORDER — IOHEXOL 350 MG/ML SOLN
190.0000 mL | Freq: Once | INTRAVENOUS | Status: AC | PRN
  Administered 2023-05-22: 190 mL via INTRAVENOUS

## 2023-05-22 MED ORDER — NITROGLYCERIN 0.4 MG SL SUBL
0.8000 mg | SUBLINGUAL_TABLET | Freq: Once | SUBLINGUAL | Status: AC
  Administered 2023-05-22: 0.8 mg via SUBLINGUAL

## 2023-05-22 NOTE — Progress Notes (Signed)
2D echo showed normal LVF with no RWMAs.  Will proceed with coronary CTA today to define coronary anatomy

## 2023-05-22 NOTE — Plan of Care (Signed)

## 2023-05-22 NOTE — Plan of Care (Signed)

## 2023-05-22 NOTE — Progress Notes (Signed)
HD#0 SUBJECTIVE:  Patient Summary: Jose Rush is a 62 year old male with a past medical history of cervical disc disorder with radiculopathy of cervical region and tobacco use disorder who presented to the ED with 3 days of sore throat, generalized weakness, and shortness of breath and was admitted with concern for unstable angina   Overnight Events: No acute events overnight  Interim History: Patient was evaluated at bedside.  Noted to have had substantial improvement in his chest pain and shortness of breath.  Noted to have persistent nasal congestion.  Patient denied any other chest pain, abdominal pain, troubles breathing, or any other signs or symptoms.  OBJECTIVE:  Vital Signs: Vitals:   05/21/23 1956 05/21/23 2330 05/22/23 0200 05/22/23 0402  BP: (!) 140/93 113/71  105/69  Pulse: 70 64 (!) 58 (!) 58  Resp: (!) 22 17 13 14   Temp: 98.6 F (37 C) 98.4 F (36.9 C)  98 F (36.7 C)  TempSrc: Oral Oral  Oral  SpO2: 90% 96% 96% 95%  Weight:      Height:       Supplemental O2: Room Air SpO2: 95 %  Filed Weights   05/21/23 1034  Weight: 83.9 kg     Intake/Output Summary (Last 24 hours) at 05/22/2023 0751 Last data filed at 05/22/2023 0445 Gross per 24 hour  Intake 1080 ml  Output 675 ml  Net 405 ml   Net IO Since Admission: 405 mL [05/22/23 0751]  CBC    Component Value Date/Time   WBC 14.6 (H) 05/22/2023 0208   RBC 4.52 05/22/2023 0208   HGB 14.9 05/22/2023 0208   HCT 42.6 05/22/2023 0208   PLT 193 05/22/2023 0208   MCV 94.2 05/22/2023 0208   MCH 33.0 05/22/2023 0208   MCHC 35.0 05/22/2023 0208   RDW 11.9 05/22/2023 0208   LYMPHSABS 1.7 07/25/2009 2202   MONOABS 0.7 07/25/2009 2202   EOSABS 0.2 07/25/2009 2202   BASOSABS 0.1 07/25/2009 2202   CMP     Component Value Date/Time   NA 133 (L) 05/22/2023 0208   K 5.1 05/22/2023 0208   CL 99 05/22/2023 0208   CO2 25 05/22/2023 0208   GLUCOSE 98 05/22/2023 0208   BUN 18 05/22/2023 0208   CREATININE  1.06 05/22/2023 0208   CALCIUM 9.4 05/22/2023 0208   PROT 7.0 05/21/2023 1102   ALBUMIN 3.2 (L) 05/21/2023 1102   AST 23 05/21/2023 1102   ALT 20 05/21/2023 1102   ALKPHOS 55 05/21/2023 1102   BILITOT 0.9 05/21/2023 1102   GFRNONAA >60 05/22/2023 0208    Physical Exam: Physical Exam Constitutional:      Appearance: He is well-developed.  HENT:     Head: Normocephalic and atraumatic.     Mouth/Throat:     Comments: Oropharynx erythema Cardiovascular:     Rate and Rhythm: Normal rate and regular rhythm.  Pulmonary:     Effort: Pulmonary effort is normal.     Breath sounds: Normal breath sounds.  Abdominal:     General: Bowel sounds are normal.     Palpations: Abdomen is soft.  Skin:    General: Skin is warm.  Neurological:     General: No focal deficit present.     Mental Status: He is alert.  Psychiatric:        Mood and Affect: Mood normal.        Behavior: Behavior normal.     ASSESSMENT/PLAN:  Assessment: Principal Problem:   Unstable angina (HCC)  Plan: #Shortness of breath, resolved #Chest pain with c/f unstable angina Patient initially presented with EKG findings concerning for unstable angina due to ST depressions and normal troponins.  Currently followed by cardiology.  Per cardiology, they feel that the symptoms are more likely associated with an upper respiratory tract infection.  This pain is present at rest and worse with coughing but described as heavy. The chest pain is also reproducible with palpation. Will follow-up with an echo to rule out pericardial effusion and assess wall motion.  If echo shows normal left ventricular function, then we will follow-up with a coronary CTA to define coronary anatomy.  If left ventricular function is reduced, then we will follow-up with cath. - Follow-up ESR and CRP - Follow-up echo -Follow-up cardiology - recs appreciated  #Viral pharyngitis #Leukocytosis Leukocytosis improved with new white blood cell count of  14.6 that was down from 17.8 yesterday.  Sore throat has improved as well.  Patient still endorses sinus congestion.  Patient has remained afebrile.  Current symptoms likely viral in nature and we will continue to support conservatively. - Begin Flonase for nasal congestion  Chronic Conditions #Social Determinants of Health  Patient currently homeless and lives under bridge.  Noted to have been released from prison 1 year ago.   #Hypertension Not currently medicated and has not taken any medications in the past.   Best Practice: Diet: Full liquid IVF: Fluids: None VTE: enoxaparin (LOVENOX) injection 40 mg Start: 05/21/23 1630 Code: Full AB: None Therapy Recs: Pending, DME: Pending DISPO: Anticipated discharge tomorrow to Home pending Medication compliance.  Signature: Morrie Sheldon, MD Internal Medicine Resident, PGY-1 [facility]  Pager: [phone]  Please contact the on call pager after 5 pm and on weekends at [phone].

## 2023-05-22 NOTE — Progress Notes (Signed)
2D echo IMPRESSIONS     1. Left ventricular ejection fraction, by estimation, is 60 to 65%. The  left ventricle has normal function. The left ventricle has no regional  wall motion abnormalities. Left ventricular diastolic parameters were  normal.   2. Right ventricular systolic function is normal. The right ventricular  size is normal.   3. The mitral valve is normal in structure. Trivial mitral valve  regurgitation. No evidence of mitral stenosis.   4. The aortic valve is normal in structure. Aortic valve regurgitation is  not visualized. No aortic stenosis is present.   5. The inferior vena cava is normal in size with greater than 50%  respiratory variability, suggesting right atrial pressure of 3 mmHg.   No pericardial effusion on echo.  Coronary CTA completed with verbal read from Dr. 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   1-24% pRCA, 1-24% D1, 25-49% mLAD, 25-49% mid LCx and OM1 FFR with no flow limiting lesions.  Coronary Ca score 349.  Recommend starting ASA 81mg  daily if not allergic (?nausea with ASA in the past), check Lp(a) and start low dose Atorvastatin 10mg  daily. Will need FLp and ALT in 6 weeks.    I do not think the CAD is the etiology of patient's CP and likely related to URI  No further recs - we will get him back into our office for followup in 4-6 weeks  [facility] will sign off.   Medication Recommendations:  ASA 81mg  daily, Atorvastatin 10mg  daily Other recommendations (labs, testing, etc):  FLP and ALT in 6 weeks Follow up as an outpatient:  Extender or Dr. Mayford Knife in 3-4 weeks

## 2023-05-22 NOTE — Progress Notes (Signed)
Progress Note  Patient Name: Jose Rush Date of Encounter: 05/22/2023  [facility] Cardiologist: None   Subjective   Was better today.  Still has a sore throat and also some mild chest heaviness.  Inpatient Medications    Scheduled Meds:  enoxaparin (LOVENOX) injection  40 mg Subcutaneous Q24H   feeding supplement  237 mL Oral BID BM   influenza vac split trivalent PF  0.5 mL Intramuscular Tomorrow-1000   Continuous Infusions:  PRN Meds: acetaminophen **OR** acetaminophen, phenol, senna-docusate   Vital Signs    Vitals:   05/21/23 2330 05/22/23 0200 05/22/23 0402 05/22/23 0815  BP: 113/71  105/69 136/80  Pulse: 64 (!) 58 (!) 58 75  Resp: 17 13 14 18   Temp: 98.4 F (36.9 C)  98 F (36.7 C) 98 F (36.7 C)  TempSrc: Oral  Oral Oral  SpO2: 96% 96% 95% 95%  Weight:      Height:        Intake/Output Summary (Last 24 hours) at 05/22/2023 0933 Last data filed at 05/22/2023 0445 Gross per 24 hour  Intake 1080 ml  Output 675 ml  Net 405 ml      05/21/2023   10:34 AM 01/29/2023   11:00 AM 01/01/2023   11:00 AM  Last 3 Weights  Weight (lbs) 185 lb 161 lb 160 lb 6.4 oz  Weight (kg) 83.915 kg 73.029 kg 72.757 kg      Telemetry    NSR - Personally Reviewed  ECG    No new EKG to review- Personally Reviewed  Physical Exam   GEN: No acute distress.   Neck: No JVD Cardiac: RRR, no murmurs, rubs, or gallops.  Respiratory: Clear to auscultation bilaterally. GI: Soft, nontender, non-distended  MS: No edema; No deformity. Neuro:  Nonfocal  Psych: Normal affect   Labs    High Sensitivity Troponin:   Recent Labs  Lab 05/21/23 1102 05/21/23 1256  TROPONINIHS 12 11      Chemistry Recent Labs  Lab 05/21/23 1102 05/22/23 0208  NA 135 133*  K 3.3* 5.1  CL 99 99  CO2 25 25  GLUCOSE 93 98  BUN 13 18  CREATININE 1.18 1.06  CALCIUM 9.4 9.4  PROT 7.0  --   ALBUMIN 3.2*  --   AST 23  --   ALT 20  --   ALKPHOS 55  --   BILITOT 0.9  --    GFRNONAA >60 >60  ANIONGAP 11 9     Hematology Recent Labs  Lab 05/21/23 1102 05/22/23 0208  WBC 17.8* 14.6*  RBC 4.97 4.52  HGB 16.7 14.9  HCT 47.4 42.6  MCV 95.4 94.2  MCH 33.6 33.0  MCHC 35.2 35.0  RDW 12.2 11.9  PLT 192 193    BNP Recent Labs  Lab 05/21/23 1102  BNP 31.6     DDimer No results for input(s): "DDIMER" in the last 168 hours.   CHA2DS2-VASc Score =     This indicates a  % annual risk of stroke. The patient's score is based upon:        Radiology    DG Chest Port 1 View  Result Date: 05/21/2023 CLINICAL DATA:  Chest pain EXAM: PORTABLE CHEST 1 VIEW COMPARISON:  12/15/2015 FINDINGS: Artifact overlies the chest. Heart and mediastinal shadows are normal. The lungs are clear. No infiltrate, collapse or effusion. No abnormal bone finding. IMPRESSION: No active disease. Electronically Signed   By: Scherrie Bateman.D.  On: 05/21/2023 14:31    Patient Profile     62 y.o. male with a hx of homelessness, GERD, cervical radiculopathy, tobacco abuse, remote alcohol abuse, who is being seen 05/21/2023 for the evaluation of chest pain at the request of Dr. Jearld Fenton.   Assessment & Plan     Chest Pain -Symptoms sound more like URI with sore throat, swollen lymph nodes, myalgias, cough, SOB.  -WBC elevated at 17.8 on admission and now 14.6.  Chest x-ray with no active disease -HIV/COVID/Flu?RSV negative -Chest pain occurs at rest and worse with cough but described as a heaviness.  -He certainly has CRFs including ongoing tobacco abuse.  -hsTrop neg x 2.  -2D echo pending to rule out pericardial effusion and assess wall motion  -check ESR and hsCRP for pericarditis.  -If 2D echo showed normal LVF then will get coronary CTA to define coronary anatomy.  -If LVF reduced then cath.  -Lipids this admission show LDL 71, HDL 74 and triglycerides 47.  HbA1c 5.1%     For questions or updates, please contact [facility] Please consult [email or web address] for  contact info under        Signed, Armanda Magic, MD  05/22/2023, 9:33 AM

## 2023-05-22 NOTE — TOC Initial Note (Addendum)
Transition of Care [facility]) - Initial/Assessment Note    Patient Details  Name: Jose Rush MRN: [id number] Date of Birth: 12-17-1960  Transition of Care [facility]) CM/SW Contact:    Leone Haven, RN Phone Number: 05/22/2023, 11:05 AM  Clinical Narrative:                 Homeless,  has PCP and insurance on file, states has no HH services in place at this time or DME .  States he will need a cab voucher at [employer] and has no  support system, states gets medications from [facility] ,but does not take any medications.  Pta self ambulatory. Follow up apt on [facility] with [facility].  NCM gave patient resources for food, shelter and transportation.  Expected Discharge Plan: Homeless Shelter Barriers to Discharge: Continued Medical Work up   Patient Goals and CMS Choice Patient states their goals for this hospitalization and ongoing recovery are:: shelter   Choice offered to / list presented to : NA      Expected Discharge Plan and Services In-house Referral: NA   Post Acute Care Choice: NA Living arrangements for the past 2 months: Homeless Shelter                 DME Arranged: N/A DME Agency: NA       HH Arranged: NA          Prior Living Arrangements/Services Living arrangements for the past 2 months: Homeless Shelter Lives with:: Self Patient language and need for interpreter reviewed:: Yes Do you feel safe going back to the place where you live?: Yes      Need for Family Participation in Patient Care: No (Comment) Care giver support system in place?: No (comment)   Criminal Activity/Legal Involvement Pertinent to Current Situation/Hospitalization: No - Comment as needed  Activities of Daily Living   ADL Screening (condition at time of admission) Independently performs ADLs?: Yes (appropriate for developmental age) Is the patient deaf or have difficulty hearing?: Yes (left ear) Does the patient have difficulty seeing, even when wearing glasses/contacts?: No Does  the patient have difficulty concentrating, remembering, or making decisions?: No  Permission Sought/Granted Permission sought to share information with : Case Manager Permission granted to share information with : Yes, Verbal Permission Granted              Emotional Assessment   Attitude/Demeanor/Rapport: Engaged Affect (typically observed): Appropriate Orientation: : Oriented to Self, Oriented to Place, Oriented to  Time, Oriented to Situation Alcohol / Substance Use: Tobacco Use Psych Involvement: No (comment)  Admission diagnosis:  Unstable angina (HCC) [I20.0] Viral pharyngitis [J02.9] Chest pain, atypical [R07.89] Patient Active Problem List   Diagnosis Date Noted   Unstable angina (HCC) 05/21/2023   Cervical disc disorder with radiculopathy of cervical region 08/20/2016   PCP:  Storm Frisk, MD Pharmacy:   [facility] [phone] - [place], [place] - [address] [place] [address] Phone: [phone] Fax: [phone]  [facility] [address] [place] [address] Phone: [phone] Fax: [phone]  [id number] [place], [place] - [address] AT [facility] [address] [place] [address] Phone: [phone] Fax: [phone]     Social Determinants of Health (SDOH) Social History: SDOH Screenings   Food Insecurity: No Food Insecurity (05/21/2023)  Housing: High Risk (05/21/2023)  Transportation Needs: Unmet  Transportation Needs (05/21/2023)  Utilities: At Risk (05/21/2023)  Tobacco Use: High Risk (05/21/2023)   SDOH Interventions:     Readmission Risk Interventions     No data to display

## 2023-05-22 NOTE — Hospital Course (Addendum)
#  Shortness of breath #Chest pain c/f Unstable Angina but likely 2/2 viral pharyngitis  Patient noted chest pain that began earlier in the morning before he woke up.  He had no history of similar chest pain in the past. Noted to also have troubles swallowing with erythematous sore throat but patient passed bedside swallow study and could tolerate full liquid diet. Troponin within normal limits.  BNP within normal limits.  EKG notable for mild ST depressions. Resp panel negative. WBC elevated to 17.8. BMP WNL. Lipase WNL. GAS negative. A1c normal at 5.1. Lipid level WNL (LDL of 71). Followed by cards who felt pain was more likely from URI and not CAD. ESR elevated at 35. CRP pending. Echo showed normal function with EF of 60-65%. CT coronary showed no evidence of significant functional stenosis but coronary calcium score of 349. LPA pending. Recommended to begin aspirin and atorvastatin and follow-up in 6 weeks to check FLP and ALT. Will follow-up with cardiology in 4-6 weeks. Patient refused labs prior to discharge so unclear if leukocytosis continued to downtrend from level of 14.6 the previous day. Stable at discharge.   #Hypokalemia Noted to be 3.3 on admission.  Patient refused labs prior at discharge so follow-up at hospital follow-up.   #Social Determinants of Health  Patient currently homeless. Seen of TOC who scheduled follow-up appointment with [facility]. Patient given resources for food, shelter, and transportation.

## 2023-05-23 ENCOUNTER — Other Ambulatory Visit (HOSPITAL_COMMUNITY): Payer: Self-pay

## 2023-05-23 DIAGNOSIS — I200 Unstable angina: Secondary | ICD-10-CM | POA: Diagnosis not present

## 2023-05-23 LAB — CBC
HCT: 42.4 % (ref 39.0–52.0)
Hemoglobin: 14.8 g/dL (ref 13.0–17.0)
MCH: 32.7 pg (ref 26.0–34.0)
MCHC: 34.9 g/dL (ref 30.0–36.0)
MCV: 93.6 fL (ref 80.0–100.0)
Platelets: 220 10*3/uL (ref 150–400)
RBC: 4.53 MIL/uL (ref 4.22–5.81)
RDW: 11.9 % (ref 11.5–15.5)
WBC: 9.1 10*3/uL (ref 4.0–10.5)
nRBC: 0 % (ref 0.0–0.2)

## 2023-05-23 LAB — BASIC METABOLIC PANEL
Anion gap: 10 (ref 5–15)
BUN: 15 mg/dL (ref 6–20)
CO2: 27 mmol/L (ref 22–32)
Calcium: 9.6 mg/dL (ref 8.9–10.3)
Chloride: 99 mmol/L (ref 98–111)
Creatinine, Ser: 0.97 mg/dL (ref 0.61–1.24)
GFR, Estimated: >60 mL/min (ref >60)
Glucose, Bld: 117 mg/dL — ABNORMAL HIGH (ref 70–99)
Potassium: 3.6 mmol/L (ref 3.5–5.1)
Sodium: 136 mmol/L (ref 135–145)

## 2023-05-23 MED ORDER — FLUTICASONE PROPIONATE 50 MCG/ACT NA SUSP
2.0000 | Freq: Every day | NASAL | 0 refills | Status: DC
  Filled 2023-05-23: qty 16, 30d supply, fill #0

## 2023-05-23 MED ORDER — ACETAMINOPHEN 325 MG PO TABS
650.0000 mg | ORAL_TABLET | Freq: Four times a day (QID) | ORAL | Status: DC | PRN
  Administered 2023-05-23: 650 mg via ORAL
  Filled 2023-05-23: qty 2

## 2023-05-23 MED ORDER — ATORVASTATIN CALCIUM 10 MG PO TABS
10.0000 mg | ORAL_TABLET | Freq: Every day | ORAL | 0 refills | Status: DC
  Filled 2023-05-23: qty 30, 30d supply, fill #0

## 2023-05-23 MED ORDER — SALINE SPRAY 0.65 % NA SOLN
1.0000 | NASAL | 0 refills | Status: AC | PRN
  Filled 2023-05-23: qty 44, 30d supply, fill #0

## 2023-05-23 MED ORDER — OMEPRAZOLE 20 MG PO CPDR
20.0000 mg | DELAYED_RELEASE_CAPSULE | Freq: Every day | ORAL | 0 refills | Status: DC
  Filled 2023-05-23: qty 30, 30d supply, fill #0

## 2023-05-23 MED ORDER — PHENOL 1.4 % MT LIQD
1.0000 | OROMUCOSAL | 0 refills | Status: AC | PRN
  Filled 2023-05-23: qty 236, fill #0

## 2023-05-23 MED ORDER — ACETAMINOPHEN 650 MG RE SUPP: 650.0000 mg | Freq: Four times a day (QID) | RECTAL | Status: DC | PRN

## 2023-05-23 MED ORDER — ASPIRIN 81 MG PO TBEC
81.0000 mg | DELAYED_RELEASE_TABLET | Freq: Every day | ORAL | 0 refills | Status: DC
  Filled 2023-05-23: qty 30, 30d supply, fill #0

## 2023-05-23 NOTE — Progress Notes (Signed)
Pt states his arm was swollen at the IV insertion site and there was "black stuff" in his catheter so he removed it. Upon assessment there was no swelling and there was bright red blood in the catheter. Catheter tip was still intact.

## 2023-05-23 NOTE — Progress Notes (Signed)
Pt removed telemetry leads while ambulating to bathroom. Pt refusing to have leads re-applied stating that it is not necessary since he is being discharged today. Educated pt on rational for wearing telemetry and risks associated with not having monitoring equipment on. Pt adamantly refusing at this time. Pt refusing AM labs as well, using same rational that he is being discharged and that his arms are sore. Carlynn Purl, MD notified. Will continue to encourage pt to comply with ordered monitoring and lab work.

## 2023-05-23 NOTE — Discharge Summary (Signed)
Name: Jose Rush MRN: [id number] DOB: [birth year] 62 y.o. PCP: Storm Frisk, MD  Date of Admission: 05/21/2023 10:29 AM Date of Discharge:  05/23/23 Attending Physician: Dr. Antony Contras  DISCHARGE DIAGNOSIS:  Primary Problem: Unstable angina [facility])   Hospital Problems: Principal Problem:   Unstable angina (HCC)    DISCHARGE MEDICATIONS:   Allergies as of 05/23/2023       Reactions   Asa [aspirin] Nausea And Vomiting   Codeine Nausea And Vomiting        Medication List     STOP taking these medications    PRESCRIPTION MEDICATION       TAKE these medications    acetaminophen 500 MG tablet Commonly known as: TYLENOL Take 1 tablet (500 mg total) by mouth every 8 (eight) hours as needed.   aspirin EC 81 MG tablet Take 1 tablet (81 mg total) by mouth daily. Swallow whole.   atorvastatin 10 MG tablet Commonly known as: Lipitor Take 1 tablet (10 mg total) by mouth daily.   fluticasone 50 MCG/ACT nasal spray Commonly known as: FLONASE Place 2 sprays into both nostrils daily.   phenol 1.4 % Liqd Commonly known as: CHLORASEPTIC Use as directed 1 spray in the mouth or throat as needed for throat irritation / pain.   PriLOSEC 10 MG Pack Generic drug: Omeprazole Magnesium Take 10 mg by mouth daily. For ulcer What changed:  medication strength how much to take when to take this   sodium chloride 0.65 % Soln nasal spray Commonly known as: OCEAN Place 1 spray into both nostrils as needed for congestion.        DISPOSITION AND FOLLOW-UP:  Jose Rush was discharged from [facility] in Stable condition. At the hospital follow up visit please address:  Follow-up Recommendations: Consults: Cardiology Labs:  LPA pending at d/c; BMP and CBC at hospital f/u; FLP, ALT in 6 weeks Studies: None Medications: ASA, atorvastatin   Follow-up Appointments:  Follow-up Information     Anders Simmonds, PA-C Follow up on 06/11/2023.    Specialty: Family Medicine Why: 1:30 for hospital follow up Contact information: [address] [address] [place] [address] [phone]         Tereso Newcomer T, PA-C Follow up on 06/20/2023.   Specialties: Cardiology, Physician Assistant Why: Scheduled for 825 AM (arrive by 810 AM) Contact information: [address] [place] [address] [phone]                 HOSPITAL COURSE:  Jose Summary: #Shortness of breath #Chest pain c/f Unstable Angina but likely 2/2 viral pharyngitis  Jose noted chest pain that began earlier in the morning before he woke up.  He had no history of similar chest pain in the past. Noted to also have troubles swallowing with erythematous sore throat but Jose passed bedside swallow study and could tolerate full liquid diet. Troponin within normal limits.  BNP within normal limits.  EKG notable for mild ST depressions. Resp panel negative. WBC elevated to 17.8. BMP WNL. Lipase WNL. GAS negative. A1c normal at 5.1. Lipid level WNL (LDL of 71). Followed by cards who felt pain was more likely from URI and not CAD. ESR elevated at 35. CRP pending. Echo showed normal function with EF of 60-65%. CT coronary showed no evidence of significant functional stenosis but coronary calcium score of 349. LPA pending. Recommended to begin aspirin and atorvastatin and follow-up in 6 weeks to check  FLP and ALT. Will follow-up with cardiology in 4-6 weeks. Jose refused labs prior to discharge so unclear if leukocytosis continued to downtrend from level of 14.6 the previous day. Stable at discharge.   #Hypokalemia Noted to be 3.3 on admission.  Jose refused labs prior at discharge so follow-up at hospital follow-up.   #Social Determinants of Health  Jose currently homeless. Seen of TOC who scheduled follow-up appointment with [facility]. Jose given resources for food, shelter, and transportation.    DISCHARGE INSTRUCTIONS:    Discharge Instructions     Call MD for:  difficulty breathing, headache or visual disturbances   Complete by: As directed    Call MD for:  extreme fatigue   Complete by: As directed    Call MD for:  hives   Complete by: As directed    Call MD for:  persistant dizziness or light-headedness   Complete by: As directed    Call MD for:  persistant nausea and vomiting   Complete by: As directed    Call MD for:  redness, tenderness, or signs of infection (pain, swelling, redness, odor or green/yellow discharge around incision site)   Complete by: As directed    Call MD for:  severe uncontrolled pain   Complete by: As directed    Call MD for:  temperature >100.4   Complete by: As directed    Diet - low sodium heart healthy   Complete by: As directed    Discharge instructions   Complete by: As directed    Jose Rush,  You were hospitalized for chest pain. This was likely caused from your viral infection that was also causing your nasal congestion, pain with swallowing, and muscle aches. You were evaluated by cardiology who would like to start you on a medication to help address your arteries that could lead to more chest pain down the road.   For your sore throat, you can continue using the nasal sprays and throat spray as needed, just as you have been during your hospitalization.   Please *START* taking the following medications:  -Aspirin 81 mg daily -Lipitor 10 mg daily -Flonase 2 sprays into both nostrils as needed for congestion  -Chloraseptic 1.4% 1 spray into mouth or throat as needed for throat pain  -Ocean 0.65% 1 spray into both nostrils as needed for congestion   We have also scheduled follow up appointments with cardiology and your primary care provider that are attached to this documents. Please continue taking your other medications as prescribed. We are glad that you are feeling better!   Increase activity slowly   Complete by: As directed        SUBJECTIVE:   Jose was evaluated at bedside. He feels okay but endorses a headache "all over." He has not received any Tylenol this morning. Thorat pain has improved and tolerating diet well. No other new concerns.   Discharge Vitals:   BP 133/81 (BP Location: Right Arm)   Pulse 68   Temp 97.7 F (36.5 C) (Oral)   Resp 18   Ht 5\' 9"  (1.753 m)   Wt 83.9 kg   SpO2 94%   BMI 27.32 kg/m   OBJECTIVE:  Physical Exam Constitutional:      Appearance: He is well-developed.  HENT:     Head: Normocephalic and atraumatic.  Cardiovascular:     Rate and Rhythm: Normal rate and regular rhythm.  Pulmonary:     Effort: Pulmonary effort is normal.     Breath sounds:  Normal breath sounds.  Abdominal:     General: Bowel sounds are normal.     Palpations: Abdomen is soft.  Musculoskeletal:        General: Normal range of motion.  Neurological:     General: No focal deficit present.     Mental Status: He is alert.  Psychiatric:        Mood and Affect: Mood normal.        Behavior: Behavior normal.     Pertinent Labs, Studies, and Procedures:     Latest Ref Rng & Units 05/22/2023    2:08 AM 05/21/2023   11:02 AM 07/25/2009   10:02 PM  CBC  WBC 4.0 - 10.5 K/uL 14.6  17.8  7.5   Hemoglobin 13.0 - 17.0 g/dL 16.1  09.6  04.5   Hematocrit 39.0 - 52.0 % 42.6  47.4  44.8   Platelets 150 - 400 K/uL 193  192  219        Latest Ref Rng & Units 05/22/2023    2:08 AM 05/21/2023   11:02 AM 07/25/2009   10:02 PM  CMP  Glucose 70 - 99 mg/dL 98  93  409   BUN 6 - 20 mg/dL 18  13  11    Creatinine 0.61 - 1.24 mg/dL 8.11  9.14  0.9   Sodium 135 - 145 mmol/L 133  135  142   Potassium 3.5 - 5.1 mmol/L 5.1  3.3  4.0   Chloride 98 - 111 mmol/L 99  99  105   CO2 22 - 32 mmol/L 25  25  28    Calcium 8.9 - 10.3 mg/dL 9.4  9.4  9.6   Total Protein 6.5 - 8.1 g/dL  7.0    Total Bilirubin 0.3 - 1.2 mg/dL  0.9    Alkaline Phos 38 - 126 U/L  55    AST 15 - 41 U/L  23    ALT 0 - 44 U/L  20      CT CORONARY  MORPH W/CTA COR W/SCORE W/CA W/CM &/OR WO/CM  Addendum Date: 05/22/2023   ADDENDUM REPORT: 05/22/2023 22:26 EXAM: OVER-READ INTERPRETATION  CT CHEST The following report is an over-read performed by radiologist Dr. Ulyess Blossom [facility] on 05/22/2023. This over-read does not include interpretation of cardiac or coronary anatomy or pathology. The cardiac CTA interpretation by the cardiologist is attached. COMPARISON:  Chest radiograph 05/21/2023 FINDINGS: Limited field of view examination for cardiac morphology. Cardiovascular: See cardiologist report of CTA heart morphology and coronary calcification study. Visualized portions of the pulmonary arteries are patent without evidence of acute pulmonary embolus. Mediastinum/Nodes: Esophagus is gas-filled and mildly distended. No focal obstructing lesion is identified. Changes may represent reflux or dysmotility. No air-fluid level. No significant mediastinal lymphadenopathy. Lungs/Pleura: Visualized lungs are clear. Upper Abdomen: Visualized upper abdominal contents are unremarkable. Musculoskeletal: No acute bony abnormalities demonstrated. IMPRESSION: 1. Mildly distended gas-filled esophagus, possibly indicating reflux disease or dysmotility. 2. Otherwise, no specific abnormality identified within the field of view. Electronically Signed   By: Burman Nieves M.D.   On: 05/22/2023 22:26   Result Date: 05/22/2023 CLINICAL DATA:  62 Year-old Male EXAM: Cardiac/Coronary CTA TECHNIQUE: The Jose was scanned on a [employer]. FINDINGS: Scan was triggered in the descending thoracic aorta. Axial non-contrast 3 mm slices were carried out through the heart. The data set was analyzed on a dedicated work station and scored using the Agatson method. Gantry rotation speed was 250 msecs and  collimation was .6 mm. 0.8 mg of sl NTG was given. The 3D data set was reconstructed in 5% intervals of the 67-82 % of the R-R cycle. Diastolic phases  were analyzed on a dedicated work station using MPR, MIP and VRT modes. The Jose received 100 cc of contrast. Coronary Arteries:  Normal coronary origin.  Right dominance. Coronary Calcium Score: Left main: 39 Left anterior descending artery: 290 Left circumflex artery: 19 Right coronary artery: 1 Ramus intermedius artery: 0 Total: 349 Percentile: 86th for age, sex, and race matched control. Plaque Analysis: Unavailable RCA is a large dominant artery that gives rise to PDA and PLA. Minimal non-obstructive mixed plaque (1-24%) in proximal RCA. Left main is a large artery that gives rise to LAD, RI, and LCX arteries. There is no significant plaque. LAD is a large vessel that gives rise to one large D1 Branch. Minimal non-obstructive mixed plaque (1-24%) in proximal D1, followed by slab artifact. Two mild non-obstructive calcified plaques (25-49%) in the mid LAD. LCX is a non-dominant artery that gives rise to one large OM1 branch. Mild non-obstructive calcified plaque (25-49%) in the mid LCX. Mild non-obstructive calcified plaque (25-49%) in the OM1 just prior to the bifurcation. Mild non-obstructive calcified plaque (25-49%) in the OM1 lateral branch after the bifurcation. There is a ramus intermedius vessel is a small vessel.  No plaque. Other findings: Aorta: Normal size, slab artifact through the Sinus of Valsalva, which is at the upper limites of normal (39 mm). No calcifications. No dissection. Main Pulmonary Artery: Normal size of the pulmonary artery. Systemic Veins: Normal drainage Aortic Valve:  Tri-leaflet.  No calcifications. Mitral valve: No calcifications.  Not well visualized. Normal pulmonary vein drainage into the left atrium. Normal left atrial appendage without a thrombus. Interatrial septum is grossly normal. Left Ventricle: Normal size Left Atrium: Normal size Right Ventricle: Normal size Right Atrium: Normal size Pericardium: Normal thickness Extra-cardiac findings: See attached radiology  report for non-cardiac structures. Artifact: Moderate slab artifact IMPRESSION: 1. Coronary calcium score of 349. This was 86th percentile for age, sex, and race matched control. 2. Normal coronary origin with right dominance. 3. CAD-RADS 2. Mild non-obstructive CAD (25-49%). Consider non-atherosclerotic causes of chest pain. Consider preventive therapy and risk factor modification. For lesions greater than 40% (mid LAD), CT-FFR would be offered; currently not available. Discussed with ordering MD. Will perform when available. RECOMMENDATIONS: RECOMMENDATIONS The proposed cut-off value of 1,651 AU yielded a 93 % sensitivity and 75 % specificity in grading AS severity in patients with classical low-flow, low-gradient AS. Proposed different cut-off values to define severe AS for men and women as 2,065 AU and 1,274 AU, respectively. The joint European and American recommendations for the assessment of AS consider the aortic valve calcium score as a continuum - a very high calcium score suggests severe AS and a low calcium score suggests severe AS is unlikely. Sunday Shams, et al. 2017 ESC/EACTS Guidelines for the management of valvular heart disease. Eur Heart J 2017;38:2739-91. Coronary artery calcium (CAC) score is a strong predictor of incident coronary heart disease (CHD) and provides predictive information beyond traditional risk factors. CAC scoring is reasonable to use in the decision to withhold, postpone, or initiate statin therapy in intermediate-risk or selected borderline-risk asymptomatic adults (age 86-75 years and LDL-C >=70 to <190 mg/dL) who do not have diabetes or established atherosclerotic cardiovascular disease (ASCVD).* In intermediate-risk (10-year ASCVD risk >=7.5% to <20%) adults or selected borderline-risk (10-year ASCVD risk >=5% to <  7.5%) adults in whom a CAC score is measured for the purpose of making a treatment decision the following recommendations have been made: If CAC  = 0, it is reasonable to withhold statin therapy and reassess in 5 to 10 years, as long as higher risk conditions are absent (diabetes mellitus, family history of premature CHD in first degree relatives (males <55 years; females <65 years), cigarette smoking, LDL >=190 mg/dL or other independent risk factors). If CAC is 1 to 99, it is reasonable to initiate statin therapy for patients >=45 years of age. If CAC is >=100 or >=75th percentile, it is reasonable to initiate statin therapy at any age. Cardiology referral should be considered for patients with CAC scores =400 or >=75th percentile. *2018 AHA/ACC/AACVPR/AAPA/ABC/ACPM/ADA/AGS/APhA/ASPC/NLA/PCNA Guideline on the Management of Blood Cholesterol: A Report of the American College of Cardiology/American Heart Association Task Force on Clinical Practice Guidelines. J Am Coll Cardiol. 2019;73(24):3168-3209. Riley Lam, MD Electronically Signed: By: Riley Lam M.D. On: 05/22/2023 19:26   CT CORONARY FFR DATA PREP & FLUID ANALYSIS  Result Date: 05/22/2023 EXAM: CT-FFR ANALYSIS CLINICAL DATA:  Possibly obstructive coronary lesion(s): Mid LAD, mid LCX, min OM1 FINDINGS: CT-FFR analysis was performed on the original cardiac CT angiogram dataset. Diagrammatic representation of the CT-FFR analysis is provided in a separate PDF document in PACS. This dictation was created using the PDF document and an interactive 3D model of the results. 3D model is not available in the EMR/PACS. Normal FFR range is >0.80. 1. Left Main: No significant functional stenosis 2. LAD: No significant functional stenosis, CT-FFR 0.86 mid LAD. 3. LCX: No significant functional stenosis, CT-FFR 0.97 mid LCX, 0.96 at the mid OM1 lesion above the bifurcation, and 0.83 at the OM1 lesion distal to the bifurcation 4. RCA: No significant functional stenosis. IMPRESSION: 1. CT FFR analysis shows no evidence of significant functional stenosis. Riley Lam MD Electronically  Signed   By: Riley Lam M.D.   On: 05/22/2023 19:29   ECHOCARDIOGRAM COMPLETE  Result Date: 05/22/2023    ECHOCARDIOGRAM REPORT   Jose Rush Date of Exam: 05/22/2023 Medical Rec #:  [id number]        Height:       69.0 in Accession #:    [id number]       Weight:       185.0 lb Date of Birth:  Jan 03, 1961         BSA:          1.999 m Jose Age:    62 years         BP:           136/80 mmHg Jose Gender: M                HR:           56 bpm. Exam Location:  Inpatient Procedure: 2D Echo, Color Doppler and Cardiac Doppler Indications:    Chest Pain  History:        Jose has no prior history of Echocardiogram examinations.                 Signs/Symptoms:Chest Pain.  Sonographer:    Milbert Coulter Referring Phys: [id number] Cyndi Bender IMPRESSIONS  1. Left ventricular ejection fraction, by estimation, is 60 to 65%. The left ventricle has normal function. The left ventricle has no regional wall motion abnormalities. Left ventricular diastolic parameters were normal.  2. Right ventricular systolic function is normal. The  right ventricular size is normal.  3. The mitral valve is normal in structure. Trivial mitral valve regurgitation. No evidence of mitral stenosis.  4. The aortic valve is normal in structure. Aortic valve regurgitation is not visualized. No aortic stenosis is present.  5. The inferior vena cava is normal in size with greater than 50% respiratory variability, suggesting right atrial pressure of 3 mmHg. FINDINGS  Left Ventricle: Left ventricular ejection fraction, by estimation, is 60 to 65%. The left ventricle has normal function. The left ventricle has no regional wall motion abnormalities. The left ventricular internal cavity size was normal in size. There is  no left ventricular hypertrophy. Left ventricular diastolic parameters were normal. Right Ventricle: The right ventricular size is normal. No increase in right ventricular wall thickness. Right ventricular  systolic function is normal. Left Atrium: Left atrial size was normal in size. Right Atrium: Right atrial size was normal in size. Pericardium: There is no evidence of pericardial effusion. Mitral Valve: The mitral valve is normal in structure. Trivial mitral valve regurgitation. No evidence of mitral valve stenosis. Tricuspid Valve: The tricuspid valve is normal in structure. Tricuspid valve regurgitation is trivial. No evidence of tricuspid stenosis. Aortic Valve: The aortic valve is normal in structure. Aortic valve regurgitation is not visualized. No aortic stenosis is present. Aortic valve mean gradient measures 6.0 mmHg. Aortic valve peak gradient measures 10.8 mmHg. Aortic valve area, by VTI measures 4.76 cm. Pulmonic Valve: The pulmonic valve was normal in structure. Pulmonic valve regurgitation is not visualized. No evidence of pulmonic stenosis. Aorta: The aortic root is normal in size and structure. Venous: The inferior vena cava is normal in size with greater than 50% respiratory variability, suggesting right atrial pressure of 3 mmHg. IAS/Shunts: No atrial level shunt detected by color flow Doppler.  LEFT VENTRICLE PLAX 2D LVIDd:         4.50 cm   Diastology LVIDs:         2.40 cm   LV e' medial:    7.83 cm/s LV PW:         1.10 cm   LV E/e' medial:  11.4 LV IVS:        0.90 cm   LV e' lateral:   12.70 cm/s LVOT diam:     2.40 cm   LV E/e' lateral: 7.0 LV SV:         136 LV SV Index:   68 LVOT Area:     4.52 cm  RIGHT VENTRICLE RV Basal diam:  3.60 cm RV Mid diam:    2.70 cm RV S prime:     12.90 cm/s TAPSE (M-mode): 1.9 cm LEFT ATRIUM             Index        RIGHT ATRIUM           Index LA diam:        3.30 cm 1.65 cm/m   RA Area:     10.50 cm LA Vol (A2C):   44.6 ml 22.31 ml/m  RA Volume:   21.30 ml  10.66 ml/m LA Vol (A4C):   30.5 ml 15.26 ml/m LA Biplane Vol: 39.4 ml 19.71 ml/m  AORTIC VALVE AV Area (Vmax):    3.83 cm AV Area (Vmean):   3.99 cm AV Area (VTI):     4.76 cm AV Vmax:            164.00 cm/s AV Vmean:  107.000 cm/s AV VTI:            0.286 m AV Peak Grad:      10.8 mmHg AV Mean Grad:      6.0 mmHg LVOT Vmax:         139.00 cm/s LVOT Vmean:        94.300 cm/s LVOT VTI:          0.301 m LVOT/AV VTI ratio: 1.05  AORTA Ao Root diam: 3.70 cm MITRAL VALVE MV Area (PHT): 3.12 cm    SHUNTS MV Decel Time: 243 msec    Systemic VTI:  0.30 m MV E velocity: 89.10 cm/s  Systemic Diam: 2.40 cm MV A velocity: 79.70 cm/s MV E/A ratio:  1.12 Arvilla Meres MD Electronically signed by Arvilla Meres MD Signature Date/Time: 05/22/2023/2:41:32 PM    Final    DG Chest Port 1 View  Result Date: 05/21/2023 CLINICAL DATA:  Chest pain EXAM: PORTABLE CHEST 1 VIEW COMPARISON:  12/15/2015 FINDINGS: Artifact overlies the chest. Heart and mediastinal shadows are normal. The lungs are clear. No infiltrate, collapse or effusion. No abnormal bone finding. IMPRESSION: No active disease. Electronically Signed   By: Paulina Fusi M.D.   On: 05/21/2023 14:31     Signed: Morrie Sheldon, MD Internal Medicine Resident, PGY-1 [facility]  Pager: [phone]

## 2023-05-23 NOTE — Progress Notes (Addendum)
Discharge instructions reviewed with pt. Pt verbalizes understanding of all instructions and about follow up appointments.  Copy of instructions given to pt. [facility] filling pt's scripts and will be picked up on the way out at discharge. Pt eating his lunch and will be ready to go in the next few minutes.  Pt to be d/c'd via wheelchair with belongings. [facility] has provided pt with a cab voucher to take him to [employer].            To be escorted by staff.   Tavi Hoogendoorn,RN SWOT

## 2023-05-23 NOTE — TOC Transition Note (Signed)
Transition of Care [facility]) - CM/SW Discharge Note   Patient Details  Name: Jose Rush MRN: [id number] Date of Birth: 1961-06-06  Transition of Care [facility]) CM/SW Contact:  Leone Haven, RN Phone Number: 05/23/2023, 11:26 AM   Clinical Narrative:    For dc today, NCM assisted with cab voucher to [employer].     Final next level of care: Homeless Shelter Barriers to Discharge: Continued Medical Work up   Patient Goals and CMS Choice   Choice offered to / list presented to : NA  Discharge Placement                         Discharge Plan and Services Additional resources added to the After Visit Summary for   In-house Referral: NA   Post Acute Care Choice: NA          DME Arranged: N/A DME Agency: NA       HH Arranged: NA          Social Determinants of Health (SDOH) Interventions SDOH Screenings   Food Insecurity: No Food Insecurity (05/21/2023)  Housing: High Risk (05/21/2023)  Transportation Needs: Unmet Transportation Needs (05/21/2023)  Utilities: At Risk (05/21/2023)  Tobacco Use: High Risk (05/21/2023)     Readmission Risk Interventions     No data to display

## 2023-05-24 LAB — HIGH SENSITIVITY CRP: CRP, High Sensitivity: 168.73 mg/L — ABNORMAL HIGH (ref 0.00–3.00)

## 2023-05-24 LAB — LIPOPROTEIN A (LPA): Lipoprotein (a): 77.7 nmol/L — ABNORMAL HIGH (ref <75.0)

## 2023-05-24 NOTE — Plan of Care (Signed)
[facility] Tonsillectomy/Adenoidectomy, Postoperative PEDS care plan entered in error.

## 2023-05-26 ENCOUNTER — Telehealth: Payer: Self-pay

## 2023-05-26 NOTE — Telephone Encounter (Signed)
-----   Message from [facility] sent at 05/26/2023  9:28 AM EDT -----  ----- Message ----- From: Quintella Reichert, MD Sent: 05/25/2023   9:39 PM EDT To: Storm Frisk, MD; Cv Div [facility] St Triage  Mildly elevated Lp(a) but already started on statin

## 2023-05-26 NOTE — Telephone Encounter (Signed)
Call to patient, informed of lab results, slightly elevated Lp(a) but already on a statin and has f/u appt w/ Tereso Newcomer on 06/20/23.

## 2023-06-11 ENCOUNTER — Inpatient Hospital Stay: Payer: Medicare Other | Admitting: Physician Assistant

## 2023-06-11 NOTE — Progress Notes (Deleted)
Patient ID: Jose Rush, male   DOB: 1960-08-30, 63 y.o.   MRN: [id number]   After hospitalization 10/16-10/18/2024 Patient Summary: #Shortness of breath #Chest pain c/f Unstable Angina but likely 2/2 viral pharyngitis  Patient noted chest pain that began earlier in the morning before he woke up.  He had no history of similar chest pain in the past. Noted to also have troubles swallowing with erythematous sore throat but patient passed bedside swallow study and could tolerate full liquid diet. Troponin within normal limits.  BNP within normal limits.  EKG notable for mild ST depressions. Resp panel negative. WBC elevated to 17.8. BMP WNL. Lipase WNL. GAS negative. A1c normal at 5.1. Lipid level WNL (LDL of 71). Followed by cards who felt pain was more likely from URI and not CAD. ESR elevated at 35. CRP pending. Echo showed normal function with EF of 60-65%. CT coronary showed no evidence of significant functional stenosis but coronary calcium score of 349. LPA pending. Recommended to begin aspirin and atorvastatin and follow-up in 6 weeks to check FLP and ALT. Will follow-up with cardiology in 4-6 weeks. Patient refused labs prior to discharge so unclear if leukocytosis continued to downtrend from level of 14.6 the previous day. Stable at discharge.    #Hypokalemia Noted to be 3.3 on admission.  Patient refused labs prior at discharge so follow-up at hospital follow-up.    #Social Determinants of Health  Patient currently homeless. Seen of TOC who scheduled follow-up appointment with [facility]. Patient given resources for food, shelter, and transportation.

## 2023-06-19 ENCOUNTER — Encounter: Payer: Self-pay | Admitting: Physician Assistant

## 2023-06-19 DIAGNOSIS — E7800 Pure hypercholesterolemia, unspecified: Secondary | ICD-10-CM

## 2023-06-19 HISTORY — DX: Pure hypercholesterolemia, unspecified: E78.00

## 2023-06-19 NOTE — Progress Notes (Deleted)
   Cardiology Office Note:    Date:  06/19/2023  ID:  Jose Rush, DOB 05-May-1961, MRN [id number] PCP: Storm Frisk, MD  [facility] Providers Cardiologist:  None { Click to update primary MD,subspecialty MD or APP then REFRESH:1}    {Click to Open Review  :1}   Patient Profile:      Coronary artery disease  CCTA 05/22/23: CAC score 349 (86th percentile); mild nonobstructive CAD [pRCA 1-24, pD1 1-24, mLAD 25-49, mLCx 25-49, OM 25-49 prior to and 25-49 after bifurcation; FFR - mLAD 0.86, mLCx 0.97, mOM 0.96, 0.83 - no hemodynamically significant stenosis TTE 05/22/23: EF 60-65, no RWMA, NL RVSF, trivial MR, RAP 3  Hyperlipidemia  Lp(a): 77.7  GERD  Tobacco use Hx of ETOH abuse  Un-housed       {      :1}   History of Present Illness:  Discussed the use of AI scribe software for clinical note transcription with the patient, who gave verbal consent to proceed.  Jose Rush is a 62 y.o. male who returns for post hospital follow up. He was admitted 10/16-10/18 with chest pain. His hsTrops were normal. He was seen by cardiology. Echocardiogram showed normal EF. CCTA showed mild nonobstructive CAD. FFR was normal. Chest pain was felt to be due to viral URI.         ROS   See HPI ***    Studies Reviewed:       *** Results          Risk Assessment/Calculations:   {Does this patient have ATRIAL FIBRILLATION?:[id number]} No BP recorded.  {Refresh Note OR Click here to enter BP  :1}***       Physical Exam:   VS:  There were no vitals taken for this visit.   Wt Readings from Last 3 Encounters:  05/21/23 185 lb (83.9 kg)  01/29/23 161 lb (73 kg)  01/01/23 160 lb 6.4 oz (72.8 kg)    Physical Exam***     Assessment and Plan:   Assessment & Plan Coronary artery disease involving native coronary artery of native heart without angina pectoris  Pure hypercholesterolemia   Assessment and Plan             {      :1}    {Are you ordering a CV  Procedure (e.g. stress test, cath, DCCV, TEE, etc)?   Press F2        :[id number]}  Dispo:  No follow-ups on file.  Signed, Tereso Newcomer, PA-C

## 2023-06-20 ENCOUNTER — Ambulatory Visit: Payer: Medicaid Other | Attending: Physician Assistant | Admitting: Physician Assistant

## 2023-06-20 DIAGNOSIS — I2510 Atherosclerotic heart disease of native coronary artery without angina pectoris: Secondary | ICD-10-CM

## 2023-06-20 DIAGNOSIS — E7800 Pure hypercholesterolemia, unspecified: Secondary | ICD-10-CM

## 2023-07-01 ENCOUNTER — Ambulatory Visit: Payer: Medicaid Other | Admitting: Nurse Practitioner

## 2023-07-01 ENCOUNTER — Other Ambulatory Visit (HOSPITAL_COMMUNITY): Payer: Self-pay

## 2023-07-01 MED ORDER — FLUTICASONE PROPIONATE 50 MCG/ACT NA SUSP: 2.0000 | Freq: Every day | NASAL | 0 refills | Status: AC

## 2023-07-01 MED ORDER — ATORVASTATIN CALCIUM 10 MG PO TABS
10.0000 mg | ORAL_TABLET | Freq: Every day | ORAL | 0 refills | Status: AC
  Filled 2023-07-01: qty 30, 30d supply, fill #0

## 2023-07-01 MED ORDER — OMEPRAZOLE 20 MG PO CPDR: 20.0000 mg | DELAYED_RELEASE_CAPSULE | Freq: Every day | ORAL | 0 refills | Status: AC

## 2023-07-01 MED ORDER — ATORVASTATIN CALCIUM 10 MG PO TABS: 10.0000 mg | ORAL_TABLET | Freq: Every day | ORAL | 0 refills | Status: AC

## 2023-07-01 MED ORDER — ASPIRIN 81 MG PO TBEC
81.0000 mg | DELAYED_RELEASE_TABLET | Freq: Every day | ORAL | 0 refills | Status: AC
  Filled 2023-07-01: qty 30, 30d supply, fill #0

## 2023-07-01 MED ORDER — OMEPRAZOLE 20 MG PO CPDR
20.0000 mg | DELAYED_RELEASE_CAPSULE | Freq: Every day | ORAL | 0 refills | Status: AC
  Filled 2023-07-01: qty 30, 30d supply, fill #0
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
## 2023-07-11 ENCOUNTER — Other Ambulatory Visit (HOSPITAL_COMMUNITY): Payer: Self-pay

## 2024-07-29 ENCOUNTER — Other Ambulatory Visit: Payer: Self-pay

## 2024-07-29 ENCOUNTER — Emergency Department (HOSPITAL_COMMUNITY)
Admission: EM | Admit: 2024-07-29 | Discharge: 2024-07-29 | Discharge status: Against Medical Advice | Attending: Emergency Medicine | Admitting: Emergency Medicine

## 2024-07-29 ENCOUNTER — Encounter (HOSPITAL_COMMUNITY): Payer: Self-pay | Admitting: Emergency Medicine

## 2024-07-29 DIAGNOSIS — Z5321 Procedure and treatment not carried out due to patient leaving prior to being seen by health care provider: Secondary | ICD-10-CM | POA: Diagnosis not present

## 2024-07-29 DIAGNOSIS — H5711 Ocular pain, right eye: Secondary | ICD-10-CM | POA: Diagnosis present

## 2024-07-29 NOTE — ED Notes (Signed)
Pt left ED d/t wait time

## 2024-07-29 NOTE — ED Triage Notes (Signed)
 Patient reports right eye pain with redness and lacrimation for 3 days , denies injury or vision loss.
